# Patient Record
Sex: Female | Born: 1940 | Race: White | Hispanic: No | Marital: Married | Smoking: Never smoker
Health system: Southern US, Community
[De-identification: ages and names within clinical notes are randomized; demographics above are authoritative.]

## PROBLEM LIST (undated history)

## (undated) DIAGNOSIS — R112 Nausea with vomiting, unspecified: Secondary | ICD-10-CM

## (undated) DIAGNOSIS — F419 Anxiety disorder, unspecified: Secondary | ICD-10-CM

## (undated) DIAGNOSIS — K219 Gastro-esophageal reflux disease without esophagitis: Secondary | ICD-10-CM

## (undated) DIAGNOSIS — I10 Essential (primary) hypertension: Secondary | ICD-10-CM

## (undated) DIAGNOSIS — M1990 Unspecified osteoarthritis, unspecified site: Secondary | ICD-10-CM

## (undated) DIAGNOSIS — C801 Malignant (primary) neoplasm, unspecified: Secondary | ICD-10-CM

## (undated) DIAGNOSIS — Z923 Personal history of irradiation: Secondary | ICD-10-CM

## (undated) DIAGNOSIS — Z9889 Other specified postprocedural states: Secondary | ICD-10-CM

## (undated) DIAGNOSIS — E079 Disorder of thyroid, unspecified: Secondary | ICD-10-CM

## (undated) DIAGNOSIS — E785 Hyperlipidemia, unspecified: Secondary | ICD-10-CM

## (undated) HISTORY — DX: Anxiety disorder, unspecified: F41.9

## (undated) HISTORY — DX: Gastro-esophageal reflux disease without esophagitis: K21.9

## (undated) HISTORY — PX: CATARACT EXTRACTION: SUR2

## (undated) HISTORY — DX: Essential (primary) hypertension: I10

## (undated) HISTORY — PX: BREAST SURGERY: SHX581

## (undated) HISTORY — DX: Malignant (primary) neoplasm, unspecified: C80.1

## (undated) HISTORY — DX: Hyperlipidemia, unspecified: E78.5

## (undated) HISTORY — DX: Disorder of thyroid, unspecified: E07.9

---

## 1983-07-06 HISTORY — PX: ABDOMINAL HYSTERECTOMY: SHX81

## 1988-07-05 HISTORY — PX: BUNIONECTOMY: SHX129

## 1990-07-05 HISTORY — PX: BLADDER SUSPENSION: SHX72

## 1998-03-06 ENCOUNTER — Observation Stay (HOSPITAL_COMMUNITY): Admission: RE | Admit: 1998-03-06 | Discharge: 1998-03-07 | Payer: Self-pay | Admitting: Urology

## 1998-05-07 ENCOUNTER — Ambulatory Visit (HOSPITAL_COMMUNITY): Admission: RE | Admit: 1998-05-07 | Discharge: 1998-05-07 | Payer: Self-pay | Admitting: Obstetrics and Gynecology

## 1999-01-12 ENCOUNTER — Ambulatory Visit (HOSPITAL_BASED_OUTPATIENT_CLINIC_OR_DEPARTMENT_OTHER): Admission: RE | Admit: 1999-01-12 | Discharge: 1999-01-12 | Payer: Self-pay | Admitting: General Surgery

## 1999-11-23 ENCOUNTER — Encounter: Admission: RE | Admit: 1999-11-23 | Discharge: 1999-11-23 | Payer: Self-pay | Admitting: General Surgery

## 1999-11-23 ENCOUNTER — Encounter: Payer: Self-pay | Admitting: General Surgery

## 2000-11-24 ENCOUNTER — Encounter: Payer: Self-pay | Admitting: Obstetrics and Gynecology

## 2000-11-24 ENCOUNTER — Ambulatory Visit (HOSPITAL_COMMUNITY): Admission: RE | Admit: 2000-11-24 | Discharge: 2000-11-24 | Payer: Self-pay | Admitting: Obstetrics and Gynecology

## 2001-03-21 ENCOUNTER — Encounter: Payer: Self-pay | Admitting: Obstetrics and Gynecology

## 2001-03-21 ENCOUNTER — Encounter: Admission: RE | Admit: 2001-03-21 | Discharge: 2001-03-21 | Payer: Self-pay | Admitting: Obstetrics and Gynecology

## 2001-12-04 ENCOUNTER — Encounter: Payer: Self-pay | Admitting: Obstetrics and Gynecology

## 2001-12-04 ENCOUNTER — Encounter: Admission: RE | Admit: 2001-12-04 | Discharge: 2001-12-04 | Payer: Self-pay | Admitting: Obstetrics and Gynecology

## 2001-12-26 ENCOUNTER — Other Ambulatory Visit: Admission: RE | Admit: 2001-12-26 | Discharge: 2001-12-26 | Payer: Self-pay | Admitting: Obstetrics and Gynecology

## 2002-10-08 ENCOUNTER — Encounter: Admission: RE | Admit: 2002-10-08 | Discharge: 2002-10-08 | Payer: Self-pay | Admitting: Obstetrics and Gynecology

## 2002-10-08 ENCOUNTER — Encounter: Payer: Self-pay | Admitting: Obstetrics and Gynecology

## 2004-04-29 ENCOUNTER — Other Ambulatory Visit: Admission: RE | Admit: 2004-04-29 | Discharge: 2004-04-29 | Payer: Self-pay | Admitting: Obstetrics and Gynecology

## 2005-04-30 ENCOUNTER — Other Ambulatory Visit: Admission: RE | Admit: 2005-04-30 | Discharge: 2005-04-30 | Payer: Self-pay | Admitting: Obstetrics and Gynecology

## 2007-06-16 ENCOUNTER — Ambulatory Visit (HOSPITAL_COMMUNITY): Admission: RE | Admit: 2007-06-16 | Discharge: 2007-06-16 | Payer: Self-pay | Admitting: Obstetrics and Gynecology

## 2007-07-18 ENCOUNTER — Ambulatory Visit: Admission: RE | Admit: 2007-07-18 | Discharge: 2007-07-18 | Payer: Self-pay | Admitting: Gynecologic Oncology

## 2008-01-08 ENCOUNTER — Encounter (INDEPENDENT_AMBULATORY_CARE_PROVIDER_SITE_OTHER): Payer: Self-pay | Admitting: Obstetrics and Gynecology

## 2008-01-08 ENCOUNTER — Ambulatory Visit (HOSPITAL_COMMUNITY): Admission: RE | Admit: 2008-01-08 | Discharge: 2008-01-08 | Payer: Self-pay | Admitting: Obstetrics and Gynecology

## 2008-07-05 HISTORY — PX: KNEE ARTHROSCOPY: SHX127

## 2010-02-23 ENCOUNTER — Encounter: Payer: Self-pay | Admitting: Endocrinology

## 2010-05-15 ENCOUNTER — Ambulatory Visit: Payer: Self-pay | Admitting: Endocrinology

## 2010-05-15 DIAGNOSIS — G4700 Insomnia, unspecified: Secondary | ICD-10-CM

## 2010-05-15 DIAGNOSIS — I10 Essential (primary) hypertension: Secondary | ICD-10-CM

## 2010-05-15 DIAGNOSIS — R51 Headache: Secondary | ICD-10-CM

## 2010-05-15 DIAGNOSIS — E039 Hypothyroidism, unspecified: Secondary | ICD-10-CM

## 2010-05-15 DIAGNOSIS — E785 Hyperlipidemia, unspecified: Secondary | ICD-10-CM | POA: Insufficient documentation

## 2010-05-15 DIAGNOSIS — R519 Headache, unspecified: Secondary | ICD-10-CM | POA: Insufficient documentation

## 2010-05-15 DIAGNOSIS — K219 Gastro-esophageal reflux disease without esophagitis: Secondary | ICD-10-CM

## 2010-05-15 LAB — CONVERTED CEMR LAB: TSH: 0.41 microintl units/mL (ref 0.35–5.50)

## 2010-08-04 NOTE — Assessment & Plan Note (Signed)
Summary: NEW ENDO CONSULT/ THYROID/ MEDICARE COMPLETE/NWS #   Vital Signs:  Patient profile:   70 year old female Height:      61.5 inches (156.21 cm) Weight:      122.25 pounds (55.57 kg) BMI:     22.81 O2 Sat:      99 % on Room air Temp:     97.1 degrees F (36.17 degrees C) oral Pulse rate:   81 / minute BP sitting:   136 / 72  (left arm) Cuff size:   regular  Vitals Entered By: Brenton Grills CMA Duncan Dull) (May 15, 2010 2:13 PM)  O2 Flow:  Room air CC: New Endo/Thyroid/[facility]/aj Is Patient Diabetic? No   Referring Provider:  Krystal Clark NP Primary Provider:  Krystal Clark NP  CC:  New Endo/Thyroid/[facility]/aj.  History of Present Illness: pt says she was dx'ed with hypothyroidism at the age of 70.  she has been on thyroid medication since then.  she has been on 88 mcg/day, x many years.  she says her thyroid blood tests have been normal.  pt states 2 mos of moderate hair loss on the head, and assoc fatigue.    Current Medications (verified): 1)  Levothyroxine Sodium 88 Mcg Tabs (Levothyroxine Sodium) .Marland Kitchen.. 1 Tablet By Mouth Once Daily 2)  Pravastatin Sodium 40 Mg Tabs (Pravastatin Sodium) .Marland Kitchen.. 1 Tablet By Mouth Once Daily 3)  Omeprazole 20 Mg Cpdr (Omeprazole) .Marland Kitchen.. 1 Capsule By Mouth Once Daily 4)  Benazepril Hcl 20 Mg Tabs (Benazepril Hcl) .Marland Kitchen.. 1 Tablet By Mouth Once Daily 5)  Multivitamins  Tabs (Multiple Vitamin) .Marland Kitchen.. 1 Tablet By Mouth Once Daily 6)  Vitamin D 1000 Unit Tabs (Cholecalciferol) .Marland Kitchen.. 1 Tablet By Mouth Once Daily 7)  Zolpidem Tartrate 10 Mg Tabs (Zolpidem Tartrate) .Marland Kitchen.. 1 Tablet By Mouth At Bedtime 8)  Nitrostat 0.4 Mg Subl (Nitroglycerin) .... Dissolve 1 Tablet Under The Tongue As Needed For Chest Pain 9)  Butalbital-Apap-Caffeine 50-325-40 Mg Tabs (Butalbital-Apap-Caffeine) .... Take 1-2 Tablets By Mouth Every 6 Hours As Needed For Severe Headache 10)  Calcium + D 600-200 Mg-Unit Tabs (Calcium  Carbonate-Vitamin D) .Marland Kitchen.. 1 Tablet By Mouth Once Daily  Allergies (verified): 1)  ! Aspirin  Past History:  Past Medical History: Hyperlipidemia Hypertension Hypothyroidism  Past Surgical History: Hysterectomy-1972 Breast Biopsy (2 about 15 years ago)  Family History: Reviewed history and no changes required. Family History Diabetes 1st degree relative Family History High cholesterol Family History Hypertension Family History Kidney disease (Other [place] Relative) no thyroid dz  Social History: Reviewed history and no changes required. Retired Married cares for her mother Never Smoked Alcohol use-no Drug use-no Regular exercise-no Smoking Status:  never Drug Use:  no Does Patient Exercise:  no Seat Belt Use:  yes  Review of Systems  The patient denies fever.         denies depression, cramps, sob, rash, memory loss, constipation, numbness, blurry vision, rhinorrhea, and syncope.  she reports insomnia, dry skin, easy bruising, and myalgias.  she has lost a few lbs.  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   Head:  head: no deformity.  i cannot detect any alopecia. eyes: no periorbital swelling, no proptosis external nose and ears are normal mouth: no lesion seen Neck:  Supple without thyroid enlargement or tenderness.  Lungs:  Clear to auscultation bilaterally. Normal respiratory effort.  Heart:  Regular rate and rhythm without  murmurs or gallops noted. Normal S1,S2.   Msk:  muscle bulk and strength are grossly normal.  no obvious joint swelling.  gait is normal and steady  Extremities:  no deformity, except for oa changes. no edema Neurologic:  cn 2-12 grossly intact.   readily moves all 4's.   sensation is intact to touch on the feet  Skin:  normal texture and temp.  no rash.  not diaphoretic  Cervical Nodes:  No significant adenopathy.  Psych:  Alert and cooperative; normal mood and  affect; normal attention span and concentration.   Additional Exam:   FastTSH                   0.41 uIU/mL     Impression & Recommendations:  Problem # 1:  HYPOTHYROIDISM (ICD-244.9) well-replaced  Problem # 2:  hair loss could have a common (autoimmune) etiology with #1  Problem # 3:  fatigue not thyroid-related  Other Orders: TLB-TSH (Thyroid Stimulating Hormone) (84443-TSH) Consultation Level IV (84696)  Patient Instructions: 1)  blood tests are being ordered for you today.  please call [phone] to hear your test results. 2)  pending the test results, please continue the same medications for now. 3)  (update: i left message on phone-tree:  rx as we discussed) 4)  ret here prn   Orders Added: 1)  TLB-TSH (Thyroid Stimulating Hormone) [84443-TSH] 2)  Consultation Level IV [32440]   Immunization History:  Influenza Immunization History:    Influenza:  historical (04/04/2010)  Pneumovax Immunization History:    Pneumovax:  historical (04/26/2010)   Immunization History:  Influenza Immunization History:    Influenza:  Historical (04/04/2010)  Pneumovax Immunization History:    Pneumovax:  Historical (04/26/2010)  Preventive Care Screening  Last Pneumovax:    Date:  04/26/2010    Results:  Historical   Last Flu Shot:    Date:  04/04/2010    Results:  Historical   Pap Smear:    Date:  03/05/2009    Results:  normal   Colonoscopy:    Date:  07/05/2004    Results:  normal     Preventive Care Screening  Last Pneumovax:    Date:  04/26/2010    Results:  Historical   Last Flu Shot:    Date:  04/04/2010    Results:  Historical   Pap Smear:    Date:  03/05/2009    Results:  normal   Colonoscopy:    Date:  07/05/2004    Results:  normal      Appended Document: NEW ENDO CONSULT/ THYROID/ MEDICARE COMPLETE/NWS # cc [facility], witrh tsh result  Appended Document: NEW ENDO CONSULT/ THYROID/ MEDICARE COMPLETE/NWS # faxed lab  report and office note to [facility] at [phone]/AJ

## 2010-08-04 NOTE — Letter (Signed)
Summary: [facility]  [facility]   Imported By: Lester [place] 05/20/2010 08:36:35  _____________________________________________________________________  External Attachment:    Type:   Image     Comment:   External Document

## 2010-11-17 NOTE — Op Note (Signed)
Zoe Boone, Zoe Boone                 ACCOUNT NO.:  [id number]   MEDICAL RECORD NO.:  [id number]          PATIENT TYPE:  AMB   LOCATION:  SDC                           FACILITY:  [facility]   PHYSICIAN:  Guy Sandifer. Henderson Cloud, M.D. DATE OF BIRTH:  1941-05-16   DATE OF PROCEDURE:  01/08/2008  DATE OF DISCHARGE:                               OPERATIVE REPORT   PREOPERATIVE DIAGNOSIS:  Left ovarian cyst.   POSTOPERATIVE DIAGNOSIS:  Left ovarian cyst.   PROCEDURE:  Laparoscopy with bilateral salpingo-oophorectomy, extensive  lysis of adhesions and pelvic washings.   SURGEON:  Harold Hedge, MD   ANESTHESIA:  General endotracheal intubation.   SPECIMENS:  Bilateral tubes and ovaries and pelvic washings all to  pathology.   ESTIMATED BLOOD LOSS:  Minimal.   INDICATIONS AND CONSENT:  This patient is a 70 year old married white  female G2, P2 status post hysterectomy with a left ovarian cyst.  Details were dictated in the history and physical.  Laparoscopy with  bilateral salpingo-oophorectomy has been discussed preoperatively.  Potential risks and complications have been discussed including but  limited to infection, organ damage, bleeding requiring transfusion of  blood products with HIV and hepatitis acquisition, DVT, PE, pneumonia,  laparotomy, pelvic pain.  All questions have been answered and consent  is signed on the chart.   FINDINGS:  Upper abdomen is grossly normal.  Appendix is normal.  In the  pelvis, there is an approximately 2-3-cm translucent parovarian cyst.  There are some adhesions of the epiploic A of the sigmoid to the median  insertion of the utero-ovarian pedicle in the vaginal cuff.  On the  right side, there is a broad filmy adhesion from the bowel to the right  tube and ovary.  The pelvic sidewalls were free bilaterally.   PROCEDURE:  The patient was taken to operating room where she was  identified, placed in dorsosupine position, and general anesthesia was  induced  via endotracheal intubation.  She was then placed in dorsal  lithotomy position where she was prepped abdominally and vaginally.  Bladder straight catheterized.  A sponge on a ring clamp was placed in  the vagina and the patient was prepped in a sterile fashion.  The  infraumbilical and suprapubic areas were injected in the midline with  0.5% plain Marcaine.  An infraumbilical incision was made and a  disposable Veress needle was placed on the first attempt without  difficulty.  A normal syringe and drop test are noted.  A 2 L of gas was  then insufflated under low pressure with good tympany in the right upper  quadrant.  Veress needle was removed.  A 10/11 XL bladeless disposable  trocar sleeve was then placed using direct visualization with the  diagnostic laparoscopic.  After placement, a small suprapubic incision  was made in the midline and a 5-mm XL bladeless disposable trocar sleeve  was placed under direct visualization.  The above findings were noted.  The adhesions to the right tube and ovary were then taken down sharply  without difficulty.  Good hemostasis was maintained.  This was  done well  clear of the gut.  The infundibulopelvic ligament was then free and  mobile.  The course of the ureter was well clear of the area of surgery.  Then using the gyrus bipolar cautery cutting instrument, the right  infundibulopelvic ligament was taken down and it was worked across to  the medial point of attachment and the entire tube and ovary were cut  free.  The adhesions of the epiploic A to the vaginal cuff at the point  of insertion of the medial aspect of the tube and ovary were then freed  without difficulty.  Minor cautery was then used for hemostasis on the  adipose tissue only.  Similar procedure was then carried out with gyrus  instrument to release the left tube and ovary as well.  Then, using the  5-mm laparoscopic through the suprapubic trocar sleeve, the EndoCatch  device was  used to retrieve first the left tube and ovary then the right  tube and ovary separately.  Switching back to the operative laparoscope,  careful inspection reveals excellent hemostasis all around.  Copious  irrigation was carried out.  It should be noted that at the beginning of  the case prior to any dissection, pelvic washings were taken and the  fluid was aspirated for cytology.  The suprapubic trocar sleeve was  removed.  Pneumoperitoneum was reduced and good hemostasis was again  noted.  Pneumoperitoneum was completely reduced.  The umbilical trocar  sleeve was removed.  The umbilical incision was closed with 0 Vicryl  suture in the fascia under good visualization.  2-0 Vicryl interrupted  sutures used to reapproximate the umbilical incision and Dermabond was  placed on both incisions.  The vaginal sponges were removed.  All counts  were correct.  The patient was awakened and taken to recovery room in  stable condition.      Guy Sandifer Henderson Cloud, M.D.  Electronically Signed     JET/MEDQ  D:  01/08/2008  T:  01/08/2008  Job:  [id number]

## 2010-11-17 NOTE — H&P (Signed)
Zoe Boone, TOZER                 ACCOUNT NO.:  [id number]   MEDICAL RECORD NO.:  [id number]           PATIENT TYPE:   LOCATION:                                 FACILITY:   PHYSICIAN:  Guy Sandifer. Henderson Cloud, M.D. DATE OF BIRTH:  April 11, 1941   DATE OF ADMISSION:  DATE OF DISCHARGE:                              HISTORY & PHYSICAL   CHIEF COMPLAINT:  Ovarian cyst.   HISTORY OF PRESENT ILLNESS:  This patient is a 70 year old married white  female G2, P2, status post hysterectomy in 1979 and subsequent  laparotomy with question of ovarian cystectomy and a bladder tack  possible Burch procedure in 1987.  She recently had complaints of pelvic  discomfort.  Ultrasound in my office on June 07, 2007, revealed a 1.6-  cm cyst at the left ovary with a 5-mm nodular area noted.  The right  ovary appeared normal.  CA-125 was normal.  Consultation with Dr. Ronita Hipps of GYN/Oncology was undertaken.  He felt the odds of this  representing an ovarian malignancy was probably 1% or less.  He agreed  with the option of outpatient laparoscopic BSO.  After consideration,  the patient requested this surgery.  Potential risks and complications  have been reviewed preoperatively.  Her abdominal pain is gradually  increasing.  She has had no difficulty with her bowels, bladder, energy  level, or appetite.   PAST MEDICAL HISTORY:  1. Rosacea.  2. Hypertension.  3. Hypothyroidism.  4. DCIS of the left breast.  5. Basal cell carcinoma of the face.   PAST SURGICAL HISTORY:  1. Abdominal hysterectomy.  2. Right ovarian cystectomy with a question of a Burch procedure at      the same time.  3. Right breast cyst biopsy which was benign.   FAMILY HISTORY:  Diabetes in father, leukemia in father, eye carcinoma  in brother, and chronic hypertension in mother and father.   MEDICATIONS:  1. L-thyroxine 88 mcg daily.  2. Carvedilol 12.5 mg 3 times a day.   ALLERGIES:  Codeine.   SOCIAL HISTORY:  Denies  tobacco, alcohol, or drug abuse.   REVIEW OF SYSTEMS:  NEURO:  Denies headache.  CARDIAC:  No chest pain.  PULMONARY:  Denies shortness of breath.   PHYSICAL EXAMINATION:  VITAL SIGNS:  Height 5 feet 2 inches, weight 127  pounds, blood pressure 118/80.  HEENT:  Without thyromegaly.  LUNGS:  Clear to auscultation.  HEART:  Regular rate and rhythm.  BACK:  No CVA tenderness.  BREASTS:  No mass, traction, or discharge.  ABDOMEN:  Soft, nontender without masses.  PELVIC EXAM:  Normal genitalia without lesion.  Adnexa nontender without  masses.  EXTREMITIES:  Grossly within normal limits.  NEUROLOGICAL EXAM:  Grossly within normal limits.   ASSESSMENT:  Left ovarian cyst.   PLAN:  Laparoscopic bilateral salpingo-oophorectomy.      Guy Sandifer Henderson Cloud, M.D.  Electronically Signed     JET/MEDQ  D:  01/03/2008  T:  01/04/2008  Job:  [id number]

## 2010-11-17 NOTE — Consult Note (Signed)
Zoe Boone, KNACK                 ACCOUNT NO.:  [id number]   MEDICAL RECORD NO.:  [id number]          PATIENT TYPE:  OUT   LOCATION:  GYN                          FACILITY:  [facility]   PHYSICIAN:  John T. [name], M.D.    DATE OF BIRTH:  [birth year]   DATE OF CONSULTATION:  DATE OF DISCHARGE:                                 CONSULTATION   CHIEF COMPLAINT:  This 70 year old woman with a left adnexal mass is  seen at the request of Dr. Henderson Cloud for recommendations regarding  management.   HISTORY OF PRESENT ILLNESS:  This patient presents with an initial  complaint of sharp left lower quadrant pain intermittent over the past 6  months and associated with severe back pain.  She states the pain is  usually associated with activity but may occur when inactive and  comprises a sharp pulling lasting for few minutes.  This may or may  not radiate to the back (the patient is vague) and is not associated  with bowel function or meals.  During evaluation, she had an ultrasound  which showed a left ovary containing a  1.6 x 1.6 x 1.4 cm cyst with low-  level internal echoes and a 5 mm nodular area with no vascularity,  questionable dermoid versus  mucinous cyst.  Right ovary was negative.  There was no fluid in the cul-de-sac.  CA-125 value was low (less than  10) and a CT scan was obtained on 12/12 which revealed a left adnexal  mass measuring 1.7 x 2.7 with peripheral calcification.  Of note, the  patient had a previous hysterectomy for benign bleeding.  She notes  multiple other symptoms including atypical photosensitivity, headache,  fatigue, occasional nausea and diffuse pain all over the body  involving largely the muscles rather than joints.  She denies  arthralgias or bony pain.  There did not appear any skin rash or ulcers.   PAST MEDICAL HISTORY:  Rosacea, hypertension, hypothyroidism.  She is  status post prior abdominal hysterectomy for benign disease, right  ovarian cystectomy, bladder  sling and questionable Burch at a  different operation.  She had a basal cell removed from her face, benign  right breast biopsy and left breast biopsy with CIS.   CURRENT MEDICATIONS:  Synthroid, carvedilol, Restoril and Extra Strength  Tylenol.   ALLERGIES:  None known.   FAMILY HISTORY:  Father died of leukemia, brother had renal cancer and  another brother had melanoma.   PERSONAL AND SOCIAL HISTORY:  The patient is married and denies ethanol  or tobacco use.   REVIEW OF SYSTEMS:  As noted above, multiple areas of pain all over the  body.  Other than this detail, no positive in a 10 system review.   PHYSICAL EXAMINATION:  VITAL SIGNS:  Weight 125 pounds, blood pressure  150/100, repeat 140/90, pulse 72, temperature afebrile.  GENERAL:  The  patient states that her current pain score is three.  The patient is  extremely anxious, alert and oriented x3 in no acute distress.  LYMPH  SURVEY:  Negative for pathologic lymphadenopathy.  BACK:  No spinous or  CVA tenderness to percussion.  ABDOMEN:  Soft and benign with no  ascites, tenderness, mass or organomegaly.  Well-healed transverse  incision without hernia.  EXTREMITIES:  Full strength and range of  motion without edema, cords or Homans.  PELVIC:  External genitalia,  BUS, bladder and urethra and vagina are all normal with atrophic vaginal  mucosa.  Bimanual and rectovaginal examinations disclose absent uterus  and cervix with no mass or nodularity.  There is vague tenderness in the  left adnexa   ASSESSMENT:  1. Likely benign left adnexal mass.  2. Generalized pain syndrome of unknown etiology which might include      myofasciitis or some sort of chronic fatigue syndrome.   RECOMMENDATIONS:  I had a long discussion with the patient.  I believe  that her risks of harboring an ovarian malignancy are exceedingly small  (1% or less).  Options for management were discussed in detail including  performance of laparoscopic BSO  versus simply repeating  an ultrasound  in approximately 3 months and deferring any surgery if the patient is  relatively asymptomatic and has no evidence of change; in fact, the  patient could be followed indefinitely if she had a stable lesion on  ultrasound.  Because the likelihood of cancer is extremely low, I  believe it would be appropriate for Dr. Henderson Cloud to perform surgery if  this is her wish.      John T. [name], M.D.  Electronically Signed     JTS/MEDQ  D:  07/18/2007  T:  07/19/2007  Job:  [id number]   cc:   Guy Sandifer. Henderson Cloud, M.D.  Fax: [phone]   Telford Nab, R.N.  [address]  [place], [place] [address]

## 2011-04-01 LAB — COMPREHENSIVE METABOLIC PANEL
Alkaline Phosphatase: 47
Calcium: 9.5
GFR calc Af Amer: >60
GFR calc non Af Amer: >60
Potassium: 4.4
Sodium: 137
Total Bilirubin: 0.6

## 2011-04-01 LAB — CBC
Hemoglobin: 12.9
MCHC: 34.2
Platelets: 261
RBC: 3.93

## 2011-12-13 ENCOUNTER — Other Ambulatory Visit: Payer: Self-pay | Admitting: Internal Medicine

## 2011-12-13 DIAGNOSIS — N6320 Unspecified lump in the left breast, unspecified quadrant: Secondary | ICD-10-CM

## 2011-12-22 ENCOUNTER — Ambulatory Visit
Admission: RE | Admit: 2011-12-22 | Discharge: 2011-12-22 | Disposition: A | Discharge status: Home / Self Care | Payer: Medicare Other | Source: Ambulatory Visit | Attending: Internal Medicine | Admitting: Internal Medicine

## 2011-12-22 ENCOUNTER — Other Ambulatory Visit: Payer: Self-pay | Admitting: Internal Medicine

## 2011-12-22 DIAGNOSIS — N6320 Unspecified lump in the left breast, unspecified quadrant: Secondary | ICD-10-CM

## 2012-07-05 HISTORY — PX: COLONOSCOPY: SHX174

## 2014-01-10 ENCOUNTER — Other Ambulatory Visit: Payer: Self-pay

## 2014-02-02 DIAGNOSIS — C801 Malignant (primary) neoplasm, unspecified: Secondary | ICD-10-CM

## 2014-02-02 HISTORY — DX: Malignant (primary) neoplasm, unspecified: C80.1

## 2014-02-05 ENCOUNTER — Telehealth (INDEPENDENT_AMBULATORY_CARE_PROVIDER_SITE_OTHER): Payer: Self-pay

## 2014-02-05 NOTE — Telephone Encounter (Signed)
[facility] pushed to [facility].

## 2014-02-05 NOTE — Telephone Encounter (Signed)
Called pt to ask about r/s her appt to another day and another physician at [facility]. I asked if the pt would switch over to Dr Erroll Luna which is also a breast cancer surgeon. I explained to the pt that Dr Donne Hazel was wanting to see if the pt would see Dr Brantley Stage b/c of Dr Cristal Generous OR availability being pushed out for several weeks now. I tried to explain to the pt that we don't like to keep our breast cancer pt's waiting for a long period of time due to the surgeon's schedule. I advised pt that her new appt would be for 8/7 arrive at 2:15/2:30. I advised pt that I would get her images pushed over from [facility]. I will request her reports to be faxed to [place]. Pt understands.

## 2014-02-05 NOTE — Telephone Encounter (Signed)
LMOM for [place] w/[facility]. To call me so I can talk to her about getting images pushed over to [facility] w/[facility]. I asked for her to ask for me when she calls back.

## 2014-02-06 ENCOUNTER — Encounter (INDEPENDENT_AMBULATORY_CARE_PROVIDER_SITE_OTHER): Payer: Self-pay

## 2014-02-07 ENCOUNTER — Ambulatory Visit (INDEPENDENT_AMBULATORY_CARE_PROVIDER_SITE_OTHER): Payer: Medicare Other | Admitting: General Surgery

## 2014-02-08 ENCOUNTER — Telehealth: Payer: Self-pay | Admitting: *Deleted

## 2014-02-08 ENCOUNTER — Telehealth (INDEPENDENT_AMBULATORY_CARE_PROVIDER_SITE_OTHER): Payer: Self-pay | Admitting: *Deleted

## 2014-02-08 ENCOUNTER — Encounter (INDEPENDENT_AMBULATORY_CARE_PROVIDER_SITE_OTHER): Payer: Self-pay | Admitting: Surgery

## 2014-02-08 ENCOUNTER — Ambulatory Visit (INDEPENDENT_AMBULATORY_CARE_PROVIDER_SITE_OTHER): Payer: Medicare Other | Admitting: Surgery

## 2014-02-08 VITALS — BP 126/80 | HR 76 | Temp 97.8°F | Ht 62.0 in | Wt 121.0 lb

## 2014-02-08 DIAGNOSIS — C50919 Malignant neoplasm of unspecified site of unspecified female breast: Secondary | ICD-10-CM

## 2014-02-08 DIAGNOSIS — C50912 Malignant neoplasm of unspecified site of left female breast: Secondary | ICD-10-CM

## 2014-02-08 NOTE — Patient Instructions (Signed)
Mastectomy, With or Without Reconstruction °Mastectomy (removal of the breast) is a procedure most commonly used to treat cancer (tumor) of the breast. Different procedures are available for treatment. This depends on the stage of the tumor (abnormal growths). Discuss this with your caregiver, surgeon (a specialist for performing operations such as this), or oncologist (someone specialized in the treatment of cancer). With proper information, you can decide which treatment is best for you. Although the sound of the word cancer is frightening to all of us, the new treatments and medications can be a source of reassurance and comfort. If there are things you are worried about, discuss them with your caregiver. He or she can help comfort you and your family. Some of the different procedures for treating breast cancer are: °· Radical (extensive) mastectomy. This is an operation used to remove the entire breast, the muscles under the breast, and all of the glands (lymph nodes) under the arm. With all of the new treatments available for cancer of the breast, this procedure has become less common. °· Modified radical mastectomy. This is a similar operation to the radical mastectomy described above. In the modified radical mastectomy, the muscles of the chest wall are not removed unless one of the lessor muscles is removed. One of the lessor muscles may be removed to allow better removal of the lymph nodes. The axillary lymph nodes are also removed. Rarely, during an axillary node dissection nerves to this area are damaged. Radiation therapy is then often used to the area following this surgery. °· A total mastectomy also known as a complete or simple mastectomy. It involves removal of only the breast. The lymph nodes and the muscles are left in place. °· In a lumpectomy, the lump is removed from the breast. This is the simplest form of surgical treatment. A sentinel lymph node biopsy may also be done. Additional treatment  may be required. °RISKS AND COMPLICATIONS °The main problems that follow removal of the breast include: °· Infection (germs start growing in the wound). This can usually be treated with antibiotics (medications that kill germs). °· Lymphedema. This means the arm on the side of the breast that was operated on swells because the lymph (tissue fluid) cannot follow the main channels back into the body. This only occurs when the lymph nodes have had to be removed under the arm. °· There may be some areas of numbness to the upper arm and around the incision (cut by the surgeon) in the breast. This happens because of the cutting of or damage to some of the nerves in the area. This is most often unavoidable. °· There may be difficulty moving the arm in a full range of motion (moving in all directions) following surgery. This usually improves with time following use and exercise. °· Recurrence of breast cancer may happen with the very best of surgery and follow up treatment. Sometimes small cancer cells that cannot be seen with the naked eye have already spread at the time of surgery. When this happens other treatment is available. This treatment may be radiation, medications or a combination of both. °RECONSTRUCTION °Reconstruction of the breast may be done immediately if there is not going to be post-operative radiation. This surgery is done for cosmetic (improve appearance) purposes to improve the physical appearance after the operation. This may be done in two ways: °· It can be done using a saline filled prosthetic (an artificial breast which is filled with salt water). Silicone breast implants are now   re-approved by the FDA and are being commonly used.  Reconstruction can be done using the body's own muscle/fat/skin. Your caregiver will discuss your options with you. Depending upon your needs or choice, together you will be able to determine which procedure is best for you. Document Released: 03/16/2001 Document  Revised: 03/15/2012 Document Reviewed: 11/07/2007 [facility] Patient Information 2015 [place], [place]. This information is not intended to replace advice given to you by your health care provider. Make sure you discuss any questions you have with your health care provider. Lumpectomy A lumpectomy is a form of "breast conserving" or "breast preservation" surgery. It may also be referred to as a partial mastectomy. During a lumpectomy, the portion of the breast that contains the cancerous tumor or breast mass (the lump) is removed. Some normal tissue around the lump may also be removed to make sure all of the tumor has been removed.  LET [facility] CARE PROVIDER KNOW ABOUT:  Any allergies you have.  All medicines you are taking, including vitamins, herbs, eye drops, creams, and over-the-counter medicines.  Previous problems you or members of your family have had with the use of anesthetics.  Any blood disorders you have.  Previous surgeries you have had.  Medical conditions you have. RISKS AND COMPLICATIONS Generally, this is a safe procedure. However, problems can occur and include:  Bleeding.  Infection.  Pain.  Temporary swelling.  Change in the shape of the breast, particularly if a large portion is removed. BEFORE THE PROCEDURE  Ask your health care provider about changing or stopping your regular medicines. This is especially important if you are taking diabetes medicines or blood thinners.  Do not eat or drink anything after midnight on the night before the procedure or as directed by your health care provider. Ask your health care provider if you can take a sip of water with any approved medicines.  On the day of surgery, your health care provider will use a mammogram or ultrasound to locate and mark the tumor in your breast. These markings on your breast will show where the cut (incision) will be made. PROCEDURE   An IV tube will be put into one of your veins.  You may  be given medicine to help you relax before the surgery (sedative). You will be given one of the following:  A medicine that numbs the area (local anesthetic).  A medicine that makes you fall asleep (general anesthetic).  Your health care provider will use a kind of electric scalpel that uses heat to minimize bleeding (electrocautery knife).  A curved incision (like a smile or frown) that follows the natural curve of your breast is made, to allow for minimal scarring and better healing.  The tumor will be removed with some of the surrounding tissue. This will be sent to the lab for analysis. Your health care provider may also remove your lymph nodes at this time if needed.  Sometimes, but not always, a rubber tube called a drain will be surgically inserted into your breast area or armpit to collect excess fluid that may accumulate in the space where the tumor was. This drain is connected to a plastic bulb on the outside of your body. This drain creates suction to help remove the fluid.  The incisions will be closed with stitches (sutures).  A bandage may be placed over the incisions. AFTER THE PROCEDURE  You will be taken to the recovery area.  You will be given medicine for pain.  A small  rubber drain may be placed in the breast for 2-3 days to prevent a collection of blood (hematoma) from developing in the breast. You will be given instructions on caring for the drain before you go home.  A pressure bandage (dressing) will be applied for 1-2 days to prevent bleeding. Ask your health care provider how to care for your bandage at home. Document Released: 08/02/2006 Document Revised: 11/05/2013 Document Reviewed: 11/24/2012 [facility] Patient Information 2015 [place], [place]. This information is not intended to replace advice given to you by your health care provider. Make sure you discuss any questions you have with your health care provider.

## 2014-02-08 NOTE — Telephone Encounter (Signed)
LM with Mr. Vorce to have pt return my call.  Advise pt her MRI of Breast is scheduled for 02-19-14 at [facility].  Arrive at 10:00 a.m.  [facility] not have availability before 02-22-14 so we had to choose [facility].  Anderson Malta

## 2014-02-08 NOTE — Telephone Encounter (Signed)
Received a call from [place] at pts PCP office requesting an appt w/ Dr. Jana Hakim.  Looked pt up and saw that she has an appt with Dr. Brantley Stage today.  Informed Dierra that pt has already been referred to the surgeon and that they will refer her to Dr. Jana Hakim after a treatment plan is decided.  Called and left a message for the pt to explain this process.  Emailed Arts development officer and Dr. Brantley Stage at [facility] to make them aware since the pt has an appt today to see him.

## 2014-02-08 NOTE — Progress Notes (Signed)
Patient ID: Zoe Boone, female   DOB: 1940-11-10, 73 y.o.   MRN: [id number]  Chief Complaint  Patient presents with  . eval left breast    HPI Zoe Boone is a 73 y.o. female.   HPI Patient presents from  the [facility] for mammographic abnormality detected on screening mammogram. 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 The patient has had bilateral breast biopsies many years ago. She is a remote diagnosis of atypical ductal hyperplasia. No significant family history of breast cancer. Past Medical History  Diagnosis Date  . Anxiety   . Hypertension   . Cancer [phone]    left breast mammary carcinoma  . GERD (gastroesophageal reflux disease)   . Hyperlipidemia   . Thyroid disease     Past Surgical History  Procedure Laterality Date  . Abdominal hysterectomy    . Breast surgery    . Bladder aspiration      Family History  Problem Relation Age of Onset  . Leukemia Father   . Diabetes Father   . Cancer Father     luekemia  . Skin cancer Mother   . Hypertension Mother   . Kidney cancer Brother   . Melanoma Brother     Social History History  Substance Use Topics  . Smoking status: Never Smoker   . Smokeless tobacco: Not on file  . Alcohol Use: No    Allergies  Allergen Reactions  . Aspirin     Current Outpatient Prescriptions  Medication Sig Dispense Refill  . ALPRAZolam (XANAX) 0.25 MG tablet Take 0.25 mg by mouth at bedtime as needed for anxiety.      . benazepril (LOTENSIN) 20 MG tablet Take 20 mg by mouth daily.      . Cholecalciferol 1000 UNITS capsule Take 1,000 Units by mouth daily.      Marland Kitchen levothyroxine (SYNTHROID, LEVOTHROID) 75 MCG tablet Take 75 mcg by mouth daily before breakfast.      . metoprolol succinate (TOPROL-XL) 25 MG 24 hr tablet Take 25 mg by mouth daily.      . Multiple Vitamin (MULTIVITAMIN) tablet Take 1 tablet by mouth daily.       . nitroGLYCERIN (NITROSTAT) 0.4 MG SL tablet Place 0.4 mg under the tongue every 5 (five) minutes as needed for chest pain.      . pravastatin (PRAVACHOL) 40 MG tablet Take 40 mg by mouth daily.      . temazepam (RESTORIL) 15 MG capsule Take 15 mg by mouth at bedtime as needed for sleep.      . valACYclovir (VALTREX) 500 MG tablet Take 500 mg by mouth 2 (two) times daily.       No current facility-administered medications for this visit.    Review of Systems Review of Systems  Constitutional: Negative for fever, chills and unexpected weight change.  HENT: Negative for congestion, hearing loss, sore throat, trouble swallowing and voice change.   Eyes: Negative for visual disturbance.  Respiratory: Negative for cough and wheezing.   Cardiovascular: Negative for chest pain, palpitations and leg swelling.  Gastrointestinal: Negative for nausea, vomiting, abdominal pain, diarrhea, constipation, blood in stool, abdominal distention and anal bleeding.  Genitourinary: Negative for hematuria, vaginal bleeding and difficulty urinating.  Musculoskeletal: Negative for arthralgias.  Skin: Negative for rash and wound.  Neurological: Negative for seizures, syncope and headaches.  Hematological: Negative for adenopathy. Does not bruise/bleed easily.  Psychiatric/Behavioral: Negative for confusion.    Blood pressure 126/80, pulse 76, temperature 97.8 F (36.6 C), height 5' 2"  (1.575 m), weight 121 lb (54.885 kg).  Physical Exam Physical Exam  Constitutional: She is oriented to person, place, and time. She appears well-developed and well-nourished.  HENT:  Head: Normocephalic and atraumatic.  Eyes: Pupils are equal, round, and reactive to light. No scleral icterus.  Neck: Normal range of motion.  Cardiovascular: Normal rate and regular rhythm.   Pulmonary/Chest: Right breast exhibits no inverted nipple, no mass, no nipple discharge, no skin change and no tenderness. Left breast exhibits no  inverted nipple, no mass, no nipple discharge, no skin change and no tenderness. Breasts are symmetrical.    Musculoskeletal: Normal range of motion.  Lymphadenopathy:    She has no cervical adenopathy.  Neurological: She is alert and oriented to person, place, and time.  Skin: Skin is warm and dry.  Psychiatric: She has a normal mood and affect. Her behavior is normal. Judgment and thought content normal.    Data Reviewed Mammogram ultrasound reviewed. 7 mm mass left upper-outer quadrant core biopsy proven to be invasive ductal carcinoma. HER-2/neu negative ER and PR receptors pending  Assessment    Stage I left breast cancer  Very dense breast tissue on exam    Plan    Patient will need MRI given breast density for surgical planning. She has  very dense breasts and is difficult to examine. Return to office after MRI completed for surgical planning. Discussed both breast conservation and mastectomy with patient. She will think about her options and talk more when she returns. Family present to hear conversation and opportunity for questions given.       Elmar Antigua A. 02/08/2014, 4:57 PM

## 2014-02-11 NOTE — Telephone Encounter (Signed)
Pt returned my call and she was made aware of her appt for her MRI.  Zoe Boone

## 2014-02-12 ENCOUNTER — Telehealth: Payer: Self-pay | Admitting: *Deleted

## 2014-02-12 NOTE — Telephone Encounter (Signed)
Received referral from [facility].  Called and left message for pt to return my call so I can schedule her w/ a med onc.

## 2014-02-14 ENCOUNTER — Telehealth: Payer: Self-pay | Admitting: *Deleted

## 2014-02-14 NOTE — Telephone Encounter (Signed)
Spoke with patient and offered [facility] since she lives in [place].  Patient is requesting Dr. Jana Hakim.  Informed patient that Dr. Jana Hakim is out of town this week and I would discuss with him upon his return on Monday to give me a slot as his schedule his booked.  Informed her we would call back.  Patient verbalized understanding.

## 2014-02-19 ENCOUNTER — Ambulatory Visit
Admission: RE | Admit: 2014-02-19 | Discharge: 2014-02-19 | Disposition: A | Discharge status: Home / Self Care | Payer: Medicare Other | Source: Ambulatory Visit | Attending: Surgery | Admitting: Surgery

## 2014-02-19 DIAGNOSIS — C50912 Malignant neoplasm of unspecified site of left female breast: Secondary | ICD-10-CM

## 2014-02-19 MED ORDER — GADOBENATE DIMEGLUMINE 529 MG/ML IV SOLN: 11.0000 mL | Freq: Once | INTRAVENOUS | Status: AC | PRN

## 2014-02-20 ENCOUNTER — Telehealth: Payer: Self-pay | Admitting: *Deleted

## 2014-02-20 NOTE — Telephone Encounter (Signed)
Received appt date and time from Dr. Jana Hakim.  Called pt and confirmed 02/21/14 appt w/ her.  Unable to mail before appt letter - gave verbal.  Unable to mail welcoming packet - gave directions and instruction.  Unable to mail intake form - placed a note for one to be given a time of check in.  Informed Dr. Jana Hakim.  Created chart, added to spreadsheet and placed in his box.

## 2014-02-20 NOTE — Progress Notes (Signed)
Location of Breast Cancer:Left Breast 7 mm upper outer quadrant.  Histology per Pathology Report:01/16/14 Left needle core biopsy mass 12 o'clock 4 cm invasive mammary cancer  Receptor Status: ER(), PR (), Her2-neu (-)  Did patient present with symptoms (if so, please note symptoms) or was this found on screening mammography?:Found on screening mammogram. 02/19/14:bilateral breast PXT:[id number]:  No abnormal enhancement associated the known malignancy within the  superior left breast. There is no evidence for adenopathy and there  are no additional findings.   Past/Anticipated interventions by surgeon, if EVO:[id number] to see Dr.Cornettte 02/22/14.  Past/Anticipated interventions by medical oncology, if any: Chemotherapy: seen by Dr.Magrinat 02/21/14.chemotherapy not reccommended.ssuggests lumpectomy, radiation and 5 years of anti-estrogen.  Lymphedema issues, if any: No  Pain issues, if any: No   SAFETY ISSUES:  Prior radiation? No  Pacemaker/ICD?No   Possible current pregnancy?No hysterectomy.last period age 89.  Is the patient on methotrexate?No  Current Complaints / other details:Married. Menses age 67. 2 children first age 42.Last menstrual cycle age 49. Hormonal replacement therapy  6 to 8 years. Allergies:aspirin    Arlyss Repress, RN 02/20/2014,10:26 AM

## 2014-02-21 ENCOUNTER — Ambulatory Visit (HOSPITAL_BASED_OUTPATIENT_CLINIC_OR_DEPARTMENT_OTHER): Payer: Medicare Other | Admitting: Oncology

## 2014-02-21 ENCOUNTER — Ambulatory Visit: Payer: Medicare Other

## 2014-02-21 ENCOUNTER — Encounter: Payer: Self-pay | Admitting: Oncology

## 2014-02-21 ENCOUNTER — Telehealth: Payer: Self-pay | Admitting: Oncology

## 2014-02-21 ENCOUNTER — Encounter: Payer: Self-pay | Admitting: Radiation Oncology

## 2014-02-21 ENCOUNTER — Ambulatory Visit
Admission: RE | Admit: 2014-02-21 | Discharge: 2014-02-21 | Disposition: A | Discharge status: Home / Self Care | Payer: Medicare Other | Source: Ambulatory Visit | Attending: Radiation Oncology | Admitting: Radiation Oncology

## 2014-02-21 VITALS — BP 188/96 | HR 57 | Temp 98.6°F | Wt 119.0 lb

## 2014-02-21 VITALS — BP 187/92 | HR 59 | Temp 98.6°F | Resp 20 | Ht 62.0 in | Wt 119.2 lb

## 2014-02-21 DIAGNOSIS — I10 Essential (primary) hypertension: Secondary | ICD-10-CM | POA: Insufficient documentation

## 2014-02-21 DIAGNOSIS — Z90710 Acquired absence of both cervix and uterus: Secondary | ICD-10-CM | POA: Insufficient documentation

## 2014-02-21 DIAGNOSIS — Z510 Encounter for antineoplastic radiation therapy: Secondary | ICD-10-CM | POA: Insufficient documentation

## 2014-02-21 DIAGNOSIS — Z170 Estrogen receptor positive status [ER+]: Secondary | ICD-10-CM | POA: Insufficient documentation

## 2014-02-21 DIAGNOSIS — E039 Hypothyroidism, unspecified: Secondary | ICD-10-CM | POA: Diagnosis not present

## 2014-02-21 DIAGNOSIS — C50412 Malignant neoplasm of upper-outer quadrant of left female breast: Secondary | ICD-10-CM

## 2014-02-21 DIAGNOSIS — E785 Hyperlipidemia, unspecified: Secondary | ICD-10-CM | POA: Diagnosis not present

## 2014-02-21 DIAGNOSIS — F411 Generalized anxiety disorder: Secondary | ICD-10-CM | POA: Insufficient documentation

## 2014-02-21 DIAGNOSIS — C50419 Malignant neoplasm of upper-outer quadrant of unspecified female breast: Secondary | ICD-10-CM | POA: Insufficient documentation

## 2014-02-21 NOTE — Telephone Encounter (Signed)
per pof to sch pt appt-gave pt copy of sch °

## 2014-02-21 NOTE — Progress Notes (Signed)
Zoe Boone  Telephone:([phone] Fax:([phone]) [phone]     ID: [name] DOB: [birth year]  MR#: [id number]  OEU#:[id number]  Patient Care Team: Zoe Mina, MD as PCP - General (Internal Medicine) Zoe Cruel, MD as Consulting Physician (Oncology) Zoe Faster. 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 Additional views and left breast ultrasonography confirmed a 7 mm mass in the upper-outer quadrant. This was biopsied under ultrasound guidance in [facility] 01/17/2014, and the pathology showed ([id number]) an invasive ductal carcinoma, E-cadherin positive, grade 1 or 2, estrogen receptor 99% positive, progesterone receptor 14% positive, with no amplification of HER-2, and with an MIB-1 of 17%.  On 02/19/2014 the patient underwent bilateral breast MRI. This showed a signal void in the superior left breast, but no abnormal enhancement, suggesting that most if not all of the original lesion was removed and biopsied.  The patient's subsequent history is as detailed below  INTERVAL HISTORY: Zoe Boone was evaluated at the breast clinic 02/21/2014.  REVIEW OF SYSTEMS: She has a history of migraines, which can be severe. She is noticing some skin changes which are probably age related. She has high blood pressure, but tells me she had a recent stress test which was "fine". She does not exercise regularly but is very active in activities of daily living, and does all her housework, cocaine, shopping, and driving. A detailed review of systems today was otherwise noncontributory  PAST MEDICAL HISTORY: Past Medical History  Diagnosis Date    . Anxiety   . Hypertension   . Cancer [id number]    left breast mammary carcinoma  . GERD (gastroesophageal reflux disease)   . Hyperlipidemia   . Thyroid disease     PAST SURGICAL HISTORY: Past Surgical History  Procedure Laterality Date  . Abdominal hysterectomy    . Breast surgery    . Bladder aspiration      FAMILY HISTORY Family History  Problem Relation Age of Onset  . Leukemia Father   . Diabetes Father   . Cancer Father     luekemia  . Skin cancer Mother   . Hypertension Mother   . Kidney cancer Brother   . Melanoma Brother    the patient's father died from complications of chronic leukemia at the age of 71. He had been diagnosed at age 23. The patient's mother died from "old age" at age 90. The patient has 3 brothers and 2 sisters. One brother was diagnosed with kidney cancer at the age of 12. There is one maternal aunt (out of 2) with breast cancer diagnosed at age 60, , and one paternal aunt (33) diagnosed with breast cancer at age 69. There is no history of ovarian cancer in the family.  GYNECOLOGIC HISTORY:  No LMP recorded. Menarche age 8, first live birth age 64. The patient is GX P2. She underwent abdominal hysterectomy and subsequent bilateral salpingo-oophorectomy at age 89. She took hormone replacement for approximately 10 years.  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 Her husband Zoe Boone is retired from [employer]. It suggests that 2 of them at home, with no pets. Their daughter Zoe Boone  is a Education officer, museum in [place], as is your second daughter, Zoe Boone. The patient has 4 grandchildren, including a granddaughter who is just entering medical school in [place], 2 grandsons at [employer], and a fourth is a Print production planner. The patient attends a local [name]     ADVANCED DIRECTIVES: Not in place   HEALTH MAINTENANCE: History  Substance Use Topics  .  Smoking status: Never Smoker   . Smokeless tobacco: Not on file  . Alcohol Use: No     Colonoscopy: Up to date/Zoe Boone  PAP:  Bone density:  Lipid panel:  Allergies  Allergen Reactions  . Aspirin     Current Outpatient Prescriptions  Medication Sig Dispense Refill  . ALPRAZolam (XANAX) 0.25 MG tablet Take 0.25 mg by mouth at bedtime as needed for anxiety.      . benazepril (LOTENSIN) 20 MG tablet Take 20 mg by mouth daily.      . Cholecalciferol 1000 UNITS capsule Take 1,000 Units by mouth daily.      Marland Kitchen levothyroxine (SYNTHROID, LEVOTHROID) 75 MCG tablet Take 75 mcg by mouth daily before breakfast.      . metoprolol succinate (TOPROL-XL) 25 MG 24 hr tablet Take 25 mg by mouth daily.      . Multiple Vitamin (MULTIVITAMIN) tablet Take 1 tablet by mouth daily.      . nitroGLYCERIN (NITROSTAT) 0.4 MG SL tablet Place 0.4 mg under the tongue every 5 (five) minutes as needed for chest pain.      . pravastatin (PRAVACHOL) 40 MG tablet Take 40 mg by mouth daily.      . temazepam (RESTORIL) 15 MG capsule Take 15 mg by mouth at bedtime as needed for sleep.      . valACYclovir (VALTREX) 500 MG tablet Take 500 mg by mouth 2 (two) times daily.       No current facility-administered medications for this visit.    OBJECTIVE: Middle-aged white woman who appears stated age 73 Vitals:   02/21/14 0851  BP: 187/92  Pulse: 59  Temp: 98.6 F (37 C)  Resp: 20     Body mass index is 21.8 kg/(m^2).    ECOG FS:0 - Asymptomatic  Ocular: Sclerae unicteric, EOMs intact Ear-nose-throat: Oropharynx clear, dentition in fair repair Lymphatic: No cervical or supraclavicular adenopathy Lungs no rales or rhonchi, good excursion bilaterally Heart regular rate and rhythm, no murmur appreciated Abd soft, nontender, positive bowel sounds MSK no focal spinal tenderness, no joint edema Neuro: non-focal, well-oriented, anxious affect Breasts: The right breast is status post remote biopsy. That  was for benign fibrocystic change. There are no skin or nipple changes of concern. There are no palpable masses. The right axilla is benign. The left breast is status post recent biopsy. I do not palpate any masses. There are no skin or nipple changes of concern. The left axilla is benign.   LAB RESULTS:  CMP     Component Value Date/Time   NA 137 01/01/2008 1030   K 4.4 01/01/2008 1030   CL 102 01/01/2008 1030   CO2 30 01/01/2008 1030   GLUCOSE 95 01/01/2008 1030   BUN 15 01/01/2008 1030   CREATININE 0.75 01/01/2008 1030   CALCIUM 9.5 01/01/2008 1030   PROT 6.8 01/01/2008 1030   ALBUMIN 4.0 01/01/2008 1030   AST 22 01/01/2008 1030   ALT 17 01/01/2008 1030   ALKPHOS 47 01/01/2008 1030   BILITOT 0.6 01/01/2008 1030   GFRNONAA >60 01/01/2008 1030  GFRAA  Value: >60        The eGFR has been calculated using the MDRD equation. This calculation has not been validated in all clinical 01/01/2008 1030    I No results found for this basename: SPEP,  UPEP,   kappa and lambda light chains    Lab Results  Component Value Date   WBC 4.7 01/01/2008   HGB 12.9 01/01/2008   HCT 37.8 01/01/2008   MCV 96.2 01/01/2008   PLT 261 01/01/2008      Chemistry      Component Value Date/Time   NA 137 01/01/2008 1030   K 4.4 01/01/2008 1030   CL 102 01/01/2008 1030   CO2 30 01/01/2008 1030   BUN 15 01/01/2008 1030   CREATININE 0.75 01/01/2008 1030      Component Value Date/Time   CALCIUM 9.5 01/01/2008 1030   ALKPHOS 47 01/01/2008 1030   AST 22 01/01/2008 1030   ALT 17 01/01/2008 1030   BILITOT 0.6 01/01/2008 1030       No results found for this basename: LABCA2    No components found with this basename: LABCA125    No results found for this basename: INR,  in the last 168 hours  Urinalysis No results found for this basename: colorurine,  appearanceur,  labspec,  phurine,  glucoseu,  hgbur,  bilirubinur,  ketonesur,  proteinur,  urobilinogen,  nitrite,  leukocytesur    STUDIES: Mr Breast Bilateral W Wo  Contrast  02/19/2014   CLINICAL DATA:  Recently diagnosed left breast invasive mammary carcinoma. Preoperative evaluation.  LABS:  BUN and creatinine were obtained on site at [facility] at  [address]  Results:  BUN 11 mg/dL,  Creatinine 0.8 mg/dL.  EXAM: BILATERAL BREAST MRI WITH AND WITHOUT CONTRAST  TECHNIQUE: Multiplanar, multisequence MR images of both breasts were obtained prior to and following the intravenous administration of 89m of MultiHance  THREE-DIMENSIONAL MR IMAGE RENDERING ON INDEPENDENT WORKSTATION:  Three-dimensional MR images were rendered by post-processing of the original MR data on an independent workstation. The three-dimensional MR images were interpreted, and findings are reported in the following complete MRI report for this study. Three dimensional images were evaluated at the independent DynaCad workstation  [facility]:  [facility]. Also, comparison previous left breast ultrasound dated 01/14/2014.  FINDINGS: Breast composition: c:  Heterogeneous fibroglandular tissue  Background parenchymal enhancement: Mild  Right breast: No mass or abnormal enhancement.  Left breast: There is a signal void located within the superior left breast related to the patient's recent left breast ultrasound guided biopsy. There is no abnormal enhancement seen in this region in the area of the patient's known malignancy. There is a smaller signal void located within the inferior left breast related to a previous benign ultrasound-guided core biopsy. There is no abnormal enhancement within the left breast.  Lymph nodes: No abnormal appearing lymph nodes.  Ancillary findings:  None.  IMPRESSION: No abnormal enhancement associated the known malignancy within the superior left breast. There is no evidence for adenopathy and there are no additional findings.  RECOMMENDATION: Treatment plan.  BI-RADS  CATEGORY  6: Known biopsy-proven malignancy.   Electronically Signed   By: RLuberta RobertsonM.D.   On: 02/19/2014 15:18    ASSESSMENT: 73y.o. [place] woman s/p left upper-outer quadrant biopsy 01/16/2014 for a clinical T1b N0, stage IA invasive ductal breast cancer  PLAN: We spent the better part of today's  hour-long appointment discussing the biology of breast cancer in general, and the specifics of the patient's tumor in particular. Zoe Boone understands she has a slow-growing, not aggressive looking, stage I invasive ductal carcinoma, which "eats" estrogen. This is most consistent with a luminal-A. subtype. The benefit of chemotherapy in these cases is very marginal, and unless we have a surprise from the definitive surgery, I do not anticipate she will need chemotherapy.  She understands that optimal local therapy involves lumpectomy and radiation, and she has appointments with Dr. Pablo Ledger later today and Dr. Brantley Stage tomorrow. When she completes her radiation treatments, we will discuss anti-estrogens, which in her case likely will be tamoxifen. We discussed possible toxicities, side effects and complications in a preliminary fashion. She understands she will need to be on anti-estrogens for 5 years.  By the end of the visit Chrys Racer felt much less anxious. She understands the goal of treatment in her case is cure, and that she has an excellent chance, with standard treatment, of this breast cancer not recurring. She agrees with the overall plan. She knows to call for any problems that may develop before next visit with me, which will be mid October.    Zoe Cruel, MD   02/21/2014 9:38 AM

## 2014-02-21 NOTE — Progress Notes (Signed)
Please see the Nurse Progress Note in the MD Initial Consult Encounter for this patient. 

## 2014-02-21 NOTE — Progress Notes (Signed)
Checked in new pt with no financial concerns. °

## 2014-02-21 NOTE — Progress Notes (Signed)
Radiation Oncology         [phone]) [phone] ________________________________  Initial outpatient Consultation - Date: 02/21/2014   Name: Zoe Boone MRN: [id number]   DOB: [birth year]  REFERRING PHYSICIAN: Brantley Stage Joyice Faster., MD  DIAGNOSIS:    ICD-9-CM ICD-10-CM  1. Breast cancer of upper-outer quadrant of left female breast 174.4 C50.412    STAGE: Breast cancer of upper-outer quadrant of left female breast   Primary site: Breast   Staging method: AJCC 7th Edition   Clinical: Stage IA (T1b, N0, cM0) signed by Chauncey Cruel, MD on 02/21/2014  8:53 AM   Summary: Stage IA (T1b, N0, cM0)  HISTORY OF PRESENT ILLNESS::Zoe Boone is a 73 y.o. female  Who was found to have a left breast mass on screening mammogram. Left breast ultrasound confirmed a 7 mm mass in the upper-outer quadrant. This was biopsied and showed an invasive ductal carcinoma, E-cadherin positive, grade 1 or 2, estrogen receptor 99% positive, progesterone receptor 14% positive, with no amplification of HER-2, and with an MIB-1 of 17%. She had a blateral breast MRI earlier this week which showed a signal void in the superior left breast, but no abnormal enhancement. No abnormal appearing lymph nodes. No abnormalities in the contralateral breast. Breast conservation was recommended by Dr. Brantley Stage and she has seen Dr. Jana Hakim earlier today. She is accompanied by her husband. She had mnarche age 38, first live birth age 43. The patient is GX P2. She underwent abdominal hysterectomy and subsequent bilateral salpingo-oophorectomy at age 20. She took hormone replacement for approximately 10 years but has not taken it in many years. She has minimal pain from her biopsy. She is concerned about a "hard knot" she feels in the medial aspect of her breast.   PREVIOUS RADIATION THERAPY: No  PAST MEDICAL HISTORY:  has a past medical history of Anxiety; Hypertension; Cancer [phone]); GERD (gastroesophageal reflux disease); Hyperlipidemia; and  Thyroid disease.    PAST SURGICAL HISTORY: Past Surgical History  Procedure Laterality Date  . Abdominal hysterectomy    . Breast surgery    . Bladder aspiration    . Cataract extraction      bilat.   . Colonoscopy  2014  . Knee arthroscopy  2010    right     FAMILY HISTORY:  Family History  Problem Relation Age of Onset  . Leukemia Father   . Diabetes Father   . Cancer Father     luekemia  . Skin cancer Mother   . Hypertension Mother   . Kidney cancer Brother   . Melanoma Brother     SOCIAL HISTORY:  History  Substance Use Topics  . Smoking status: Never Smoker   . Smokeless tobacco: Not on file  . Alcohol Use: No    ALLERGIES: Aspirin  MEDICATIONS:  Current Outpatient Prescriptions  Medication Sig Dispense Refill  . ALPRAZolam (XANAX) 0.25 MG tablet Take 0.25 mg by mouth at bedtime as needed for anxiety.      . benazepril (LOTENSIN) 20 MG tablet Take 20 mg by mouth daily.      . Cholecalciferol 1000 UNITS capsule Take 1,000 Units by mouth daily.      Marland Kitchen levothyroxine (SYNTHROID, LEVOTHROID) 75 MCG tablet Take 75 mcg by mouth daily before breakfast.      . metoprolol succinate (TOPROL-XL) 25 MG 24 hr tablet Take 25 mg by mouth daily.      . Multiple Vitamin (MULTIVITAMIN) tablet Take 1 tablet by mouth daily.      Marland Kitchen  nitroGLYCERIN (NITROSTAT) 0.4 MG SL tablet Place 0.4 mg under the tongue every 5 (five) minutes as needed for chest pain.      . pravastatin (PRAVACHOL) 40 MG tablet Take 40 mg by mouth daily.      . temazepam (RESTORIL) 15 MG capsule Take 15 mg by mouth at bedtime as needed for sleep.      . valACYclovir (VALTREX) 500 MG tablet Take 500 mg by mouth 2 (two) times daily.       No current facility-administered medications for this encounter.    REVIEW OF SYSTEMS:  A 15 point review of systems is documented in the electronic medical record. This was obtained by the nursing staff. However, I reviewed this with the patient to discuss relevant findings and  make appropriate changes.  Pertinent items are noted in HPI.  PHYSICAL EXAM:  Filed Vitals:   02/21/14 1104  BP: 188/96  Pulse: 57  Temp: 98.6 F (37 C)  .119 lb (53.978 kg). Pleasant female in no distress. Thin. Appears younger than stated age. Palpable "knot" corresponds to a rib. Biopsy site healing well. Small breasts bilaterally. No palpable lymphadenopathy.   LABORATORY DATA:  Lab Results  Component Value Date   WBC 4.7 01/01/2008   HGB 12.9 01/01/2008   HCT 37.8 01/01/2008   MCV 96.2 01/01/2008   PLT 261 01/01/2008   Lab Results  Component Value Date   NA 137 01/01/2008   K 4.4 01/01/2008   CL 102 01/01/2008   CO2 30 01/01/2008   Lab Results  Component Value Date   ALT 17 01/01/2008   AST 22 01/01/2008   ALKPHOS 47 01/01/2008   BILITOT 0.6 01/01/2008     RADIOGRAPHY: Mr Breast Bilateral W Wo Contrast  02/19/2014   CLINICAL DATA:  Recently diagnosed left breast invasive mammary carcinoma. Preoperative evaluation.  LABS:  BUN and creatinine were obtained on site at [facility] at  [address]  Results:  BUN 11 mg/dL,  Creatinine 0.8 mg/dL.  EXAM: BILATERAL BREAST MRI WITH AND WITHOUT CONTRAST  TECHNIQUE: Multiplanar, multisequence MR images of both breasts were obtained prior to and following the intravenous administration of 43m of MultiHance  THREE-DIMENSIONAL MR IMAGE RENDERING ON INDEPENDENT WORKSTATION:  Three-dimensional MR images were rendered by post-processing of the original MR data on an independent workstation. The three-dimensional MR images were interpreted, and findings are reported in the following complete MRI report for this study. Three dimensional images were evaluated at the independent DynaCad workstation  [facility]:  [facility]. Also, comparison previous left breast ultrasound dated 01/14/2014.  FINDINGS: Breast composition: c:  Heterogeneous  fibroglandular tissue  Background parenchymal enhancement: Mild  Right breast: No mass or abnormal enhancement.  Left breast: There is a signal void located within the superior left breast related to the patient's recent left breast ultrasound guided biopsy. There is no abnormal enhancement seen in this region in the area of the patient's known malignancy. There is a smaller signal void located within the inferior left breast related to a previous benign ultrasound-guided core biopsy. There is no abnormal enhancement within the left breast.  Lymph nodes: No abnormal appearing lymph nodes.  Ancillary findings:  None.  IMPRESSION: No abnormal enhancement associated the known malignancy within the superior left breast. There is no evidence for adenopathy and there are no additional findings.  RECOMMENDATION: Treatment plan.  BI-RADS CATEGORY  6: Known biopsy-proven malignancy.   Electronically Signed  By: Luberta Robertson M.D.   On: 02/19/2014 15:18      IMPRESSION: T1N0 Invasive Ductal Carcinoma of the Left Breast  PLAN: I spoke to the patient today regarding her diagnosis and options for treatment. We discussed the equivalence in terms of survival and local failure between mastectomy and breast conservation.  We discussed the role of radiation and decreasing local failures in patients who undergo lumpectomy.We discussed the possible side effects including but not limited to asymptomatic rib, heart and lung damage, heart disease, skin redness, fatigue, permanent skin darkening, and chest wall swelling. We discussed the process of simulation and the placement tattoos. We discussed the low likelihood of secondary malignancies.    We discussed the results of her MRI.   She desires treatment with radiation in [place] and will need to be referred to my partner, Dr. Orlene Erm after surgery.   She will receive antiestrogen treatment from Dr. Jana Hakim after radiation is complete.   I spent 40 minutes  face to face  with the patient and more than 50% of that time was spent in counseling and/or coordination of care.   ------------------------------------------------  Thea Silversmith, MD

## 2014-02-22 ENCOUNTER — Encounter (INDEPENDENT_AMBULATORY_CARE_PROVIDER_SITE_OTHER): Payer: Self-pay | Admitting: Surgery

## 2014-02-22 ENCOUNTER — Ambulatory Visit (INDEPENDENT_AMBULATORY_CARE_PROVIDER_SITE_OTHER): Payer: Medicare Other | Admitting: Surgery

## 2014-02-22 VITALS — BP 126/78 | HR 73 | Temp 97.5°F | Ht 62.0 in | Wt 122.0 lb

## 2014-02-22 DIAGNOSIS — C50912 Malignant neoplasm of unspecified site of left female breast: Secondary | ICD-10-CM

## 2014-02-22 DIAGNOSIS — C50919 Malignant neoplasm of unspecified site of unspecified female breast: Secondary | ICD-10-CM

## 2014-02-22 NOTE — Patient Instructions (Signed)
Sentinel Lymph Node Biopsy Sentinel lymph node biopsy is a procedure in which a single lymph node is identified, removed, and examined for cancer. Lymph nodes are collections of tissue that help filter infections, cancer cells, and other waste substances from the bloodstream. Certain types of cancer can spread to nearby lymph nodes. The cancer spreads to one lymph node first, and then to others. The first lymph node that your cancer could spread to is called the sentinel lymph node. Examining the sentinel lymph node for cancer can help your caregiver plan future treatment for you. LET YOUR CAREGIVER KNOW ABOUT:   Allergies to food or medicine.  Medicines taken, including vitamins, herbs, eyedrops, over-the-counter medicines, and creams.  Use of steroids (by mouth or creams).  Previous problems with numbing medicines.  History of bleeding problems or blood clots.  Previous surgery.  Other health problems, including diabetes and kidney problems.  Possibility of pregnancy, if this applies. RISKS AND COMPLICATIONS   Infection.  Bleeding.  Allergic reaction to the dye used for the procedure.  Blue staining of the skin where the dye is injected.  Damaged lymph vessels, causing a buildup of fluid (lymphedema).  Pain or bruising at the biopsy site. BEFORE THE PROCEDURE   Stop smoking at least 2 weeks before the procedure. Not smoking will improve your health after the procedure and decrease the chance of getting a wound infection.  You may have blood tests to make sure your blood clots normally.  Ask your caregiver about changing or stopping your regular medicines.  Do not eat or drink anything for 8 hours before the procedure. PROCEDURE   You will be given medicine that makes you sleep (general anesthetic).  A blue, radioactive dye will be injected near the tumor. The dye will then spread into the sentinel lymph node.  A scanner will identify the sentinel lymph node.  A  small cut (incision) will be made, and the sentinel lymph node will be removed.  The sentinel lymph node will be examined in a lab. Sometimes, a sentinel lymph node biopsy is performed during another surgery, such as a mastectomy or lumpectomy for breast cancer.  AFTER THE PROCEDURE   You will go to a recovery room.  You will be monitored for several hours.  If complications do not occur, you will be allowed to go home a few hours after the procedure.  Your urine may be blue for the next 24 hours. This is normal. It is caused by the dye used during the procedure.  Your skin where the dye was injected may be blue for up to 8 weeks. Document Released: 09/13/2011 Document Reviewed: 08/10/2013 [facility] Patient Information 2015 [place]. This information is not intended to replace advice given to you by your health care provider. Make sure you discuss any questions you have with your health care provider. Lumpectomy A lumpectomy is a form of "breast conserving" or "breast preservation" surgery. It may also be referred to as a partial mastectomy. During a lumpectomy, the portion of the breast that contains the cancerous tumor or breast mass (the lump) is removed. Some normal tissue around the lump may also be removed to make sure all of the tumor has been removed.  LET [facility] CARE PROVIDER KNOW ABOUT:  Any allergies you have.  All medicines you are taking, including vitamins, herbs, eye drops, creams, and over-the-counter medicines.  Previous problems you or members of your family have had with the use of anesthetics.  Any blood  disorders you have.  Previous surgeries you have had.  Medical conditions you have. RISKS AND COMPLICATIONS Generally, this is a safe procedure. However, problems can occur and include:  Bleeding.  Infection.  Pain.  Temporary swelling.  Change in the shape of the breast, particularly if a large portion is removed. BEFORE THE PROCEDURE  Ask  your health care provider about changing or stopping your regular medicines. This is especially important if you are taking diabetes medicines or blood thinners.  Do not eat or drink anything after midnight on the night before the procedure or as directed by your health care provider. Ask your health care provider if you can take a sip of water with any approved medicines.  On the day of surgery, your health care provider will use a mammogram or ultrasound to locate and mark the tumor in your breast. These markings on your breast will show where the cut (incision) will be made. PROCEDURE   An IV tube will be put into one of your veins.  You may be given medicine to help you relax before the surgery (sedative). You will be given one of the following:  A medicine that numbs the area (local anesthetic).  A medicine that makes you fall asleep (general anesthetic).  Your health care provider will use a kind of electric scalpel that uses heat to minimize bleeding (electrocautery knife).  A curved incision (like a smile or frown) that follows the natural curve of your breast is made, to allow for minimal scarring and better healing.  The tumor will be removed with some of the surrounding tissue. This will be sent to the lab for analysis. Your health care provider may also remove your lymph nodes at this time if needed.  Sometimes, but not always, a rubber tube called a drain will be surgically inserted into your breast area or armpit to collect excess fluid that may accumulate in the space where the tumor was. This drain is connected to a plastic bulb on the outside of your body. This drain creates suction to help remove the fluid.  The incisions will be closed with stitches (sutures).  A bandage may be placed over the incisions. AFTER THE PROCEDURE  You will be taken to the recovery area.  You will be given medicine for pain.  A small rubber drain may be placed in the breast for 2-3 days to  prevent a collection of blood (hematoma) from developing in the breast. You will be given instructions on caring for the drain before you go home.  A pressure bandage (dressing) will be applied for 1-2 days to prevent bleeding. Ask your health care provider how to care for your bandage at home. Document Released: 08/02/2006 Document Revised: 11/05/2013 Document Reviewed: 11/24/2012 [facility] Patient Information 2015 [place], [place]. This information is not intended to replace advice given to you by your health care provider. Make sure you discuss any questions you have with your health care provider.

## 2014-02-22 NOTE — Progress Notes (Signed)
Patient ID: Zoe Boone, female   DOB: 03/09/1941, 73 y.o.   MRN: [id number]  Chief Complaint  Patient presents with  . Breast Cancer Long Term Follow Up    HPI Zoe Boone is a 73 y.o. female.   HPI Patient presents from  the [facility] for mammographic abnormality detected on screening mammogram. 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 The patient has had bilateral breast biopsies many years ago. She is a remote diagnosis of atypical ductal hyperplasia. No significant family history of breast cancer.MR shows bx changes left breast but nothing else.  Past Medical History  Diagnosis Date  . Anxiety   . Hypertension   . Cancer 82015    left breast mammary carcinoma  . GERD (gastroesophageal reflux disease)   . Hyperlipidemia   . Thyroid disease     Past Surgical History  Procedure Laterality Date  . Abdominal hysterectomy    . Breast surgery    . Bladder aspiration    . Cataract extraction      bilat.   . Colonoscopy  2014  . Knee arthroscopy  2010    right     Family History  Problem Relation Age of Onset  . Leukemia Father   . Diabetes Father   . Cancer Father     luekemia  . Skin cancer Mother   . Hypertension Mother   . Kidney cancer Brother   . Melanoma Brother     Social History History  Substance Use Topics  . Smoking status: Never Smoker   . Smokeless tobacco: Not on file  . Alcohol Use: No    Allergies  Allergen Reactions  . Aspirin     Current Outpatient Prescriptions  Medication Sig Dispense Refill  . ALPRAZolam (XANAX) 0.25 MG tablet Take 0.25 mg by mouth at bedtime as needed for anxiety.      . benazepril (LOTENSIN) 20 MG tablet Take 20 mg by mouth daily.      . Cholecalciferol 1000 UNITS capsule Take 1,000 Units by mouth daily.      . levothyroxine (SYNTHROID, LEVOTHROID) 75 MCG tablet Take 75 mcg by mouth daily before breakfast.       . metoprolol succinate (TOPROL-XL) 25 MG 24 hr tablet Take 25 mg by mouth daily.      . Multiple Vitamin (MULTIVITAMIN) tablet Take 1 tablet by mouth daily.      . nitroGLYCERIN (NITROSTAT) 0.4 MG SL tablet Place 0.4 mg under the tongue every 5 (five) minutes as needed for chest pain.      . pravastatin (PRAVACHOL) 40 MG tablet Take 40 mg by mouth daily.      . temazepam (RESTORIL) 15 MG capsule Take 15 mg by mouth at bedtime as needed for sleep.      . valACYclovir (VALTREX) 500 MG tablet Take 500 mg by mouth 2 (two) times daily.       No current facility-administered medications for this visit.    Review of Systems Review of Systems  Constitutional: Negative for fever, chills and unexpected weight change.  HENT: Negative for congestion, hearing loss, sore throat, trouble swallowing and voice change.   Eyes: Negative for visual disturbance.  Respiratory: Negative for cough and wheezing.   Cardiovascular: Negative for chest pain, palpitations and leg swelling.  Gastrointestinal: Negative for nausea, vomiting, abdominal pain, diarrhea, constipation, blood in stool, abdominal distention and anal bleeding.    Genitourinary: Negative for hematuria, vaginal bleeding and difficulty urinating.  Musculoskeletal: Negative for arthralgias.  Skin: Negative for rash and wound.  Neurological: Negative for seizures, syncope and headaches.  Hematological: Negative for adenopathy. Does not bruise/bleed easily.  Psychiatric/Behavioral: Negative for confusion.    Blood pressure 126/78, pulse 73, temperature 97.5 F (36.4 C), height 5' 2" (1.575 m), weight 122 lb (55.339 kg).  Physical Exam Physical Exam  Constitutional: She is oriented to person, place, and time. She appears well-developed and well-nourished.  HENT:  Head: Normocephalic and atraumatic.  Eyes: Pupils are equal, round, and reactive to light. No scleral icterus.  Neck: Normal range of motion.  Cardiovascular: Normal rate and  regular rhythm.   Pulmonary/Chest: Right breast exhibits no inverted nipple, no mass, no nipple discharge, no skin change and no tenderness. Left breast exhibits no inverted nipple, no mass, no nipple discharge, no skin change and no tenderness. Breasts are symmetrical.    Musculoskeletal: Normal range of motion.  Lymphadenopathy:    She has no cervical adenopathy.  Neurological: She is alert and oriented to person, place, and time.  Skin: Skin is warm and dry.  Psychiatric: She has a normal mood and affect. Her behavior is normal. Judgment and thought content normal.    Data Reviewed CLINICAL DATA: Recently diagnosed left breast invasive mammary  carcinoma. Preoperative evaluation.  LABS: BUN and creatinine were obtained on site at [facility] at  [address]  Results: BUN 11 mg/dL, Creatinine 0.8 mg/dL.  EXAM:  BILATERAL BREAST MRI WITH AND WITHOUT CONTRAST  TECHNIQUE:  Multiplanar, multisequence MR images of both breasts were obtained  prior to and following the intravenous administration of 11ml of  MultiHance  THREE-DIMENSIONAL MR IMAGE RENDERING ON INDEPENDENT WORKSTATION:  Three-dimensional MR images were rendered by post-processing of the  original MR data on an independent workstation. The  three-dimensional MR images were interpreted, and findings are  reported in the following complete MRI report for this study. Three  dimensional images were evaluated at the independent DynaCad  workstation  COMPARISON: Previous examinations from [facility] including  mammography dated 01/16/2014, 01/07/2014, 01/03/2013. Also,  comparison previous left breast ultrasound dated 01/14/2014.  FINDINGS:  Breast composition: c: Heterogeneous fibroglandular tissue  Background parenchymal enhancement: Mild  Right breast: No mass or abnormal enhancement.  Left breast: There is a signal void located within the superior left  breast related to the patient's recent left  breast ultrasound guided  biopsy. There is no abnormal enhancement seen in this region in the  area of the patient's known malignancy. There is a smaller signal  void located within the inferior left breast related to a previous  benign ultrasound-guided core biopsy. There is no abnormal  enhancement within the left breast.  Lymph nodes: No abnormal appearing lymph nodes.  Ancillary findings: None.  IMPRESSION:  No abnormal enhancement associated the known malignancy within the  superior left breast. There is no evidence for adenopathy and there  are no additional findings.  RECOMMENDATION:  Treatment plan.  BI-RADS CATEGORY 6: Known biopsy-proven malignancy.  Electronically Signed  By: Randy Jackson M.D.  On: 02/19/2014 15:18 Mammogram ultrasound reviewed. 7 mm mass left upper-outer quadrant core biopsy proven to be invasive ductal carcinoma. HER-2/neu negative ER and PR receptors pending  Assessment    Stage I left breast cancer  Very dense breast tissue on exam    Plan    Pt has noother abnormalities on MR. Recommend breast conservation with left breast   seed localized lumpectomy and SLN mapping. The procedure has been discussed with the patient. Alternatives to surgery have been discussed with the patient.  Risks of surgery include bleeding,  Infection,  Seroma formation, death,  and the need for further surgery.   The patient understands and wishes to proceed.Sentinel lymph node mapping and dissection has been discussed with the patient.  Risk of bleeding,  Infection,  Seroma formation,  Additional procedures,,  Shoulder weakness ,  Shoulder stiffness,  Nerve and blood vessel injury and reaction to the mapping dyes have been discussed.  Alternatives to surgery have been discussed with the patient.  The patient agrees to proceed.       Addeline Calarco A. 02/22/2014, 12:31 PM

## 2014-03-04 ENCOUNTER — Other Ambulatory Visit (INDEPENDENT_AMBULATORY_CARE_PROVIDER_SITE_OTHER): Payer: Self-pay | Admitting: Surgery

## 2014-03-04 DIAGNOSIS — C50912 Malignant neoplasm of unspecified site of left female breast: Secondary | ICD-10-CM

## 2014-03-14 ENCOUNTER — Encounter (HOSPITAL_BASED_OUTPATIENT_CLINIC_OR_DEPARTMENT_OTHER): Payer: Self-pay | Admitting: *Deleted

## 2014-03-14 NOTE — Progress Notes (Signed)
Pt very nervous-went through all care from seeds to surgery-all questions answered was able to review instructions-sees dr Bettina Gavia for htn control-called for notes

## 2014-03-19 ENCOUNTER — Encounter (HOSPITAL_BASED_OUTPATIENT_CLINIC_OR_DEPARTMENT_OTHER)
Admission: RE | Admit: 2014-03-19 | Discharge: 2014-03-19 | Disposition: A | Discharge status: Home / Self Care | Payer: Medicare Other | Source: Ambulatory Visit | Attending: Surgery | Admitting: Surgery

## 2014-03-19 ENCOUNTER — Ambulatory Visit
Admission: RE | Admit: 2014-03-19 | Discharge: 2014-03-19 | Disposition: A | Discharge status: Home / Self Care | Payer: Medicare Other | Source: Ambulatory Visit | Attending: Surgery | Admitting: Surgery

## 2014-03-19 DIAGNOSIS — E785 Hyperlipidemia, unspecified: Secondary | ICD-10-CM | POA: Diagnosis not present

## 2014-03-19 DIAGNOSIS — C50919 Malignant neoplasm of unspecified site of unspecified female breast: Secondary | ICD-10-CM | POA: Diagnosis not present

## 2014-03-19 DIAGNOSIS — Z79899 Other long term (current) drug therapy: Secondary | ICD-10-CM | POA: Diagnosis not present

## 2014-03-19 DIAGNOSIS — K219 Gastro-esophageal reflux disease without esophagitis: Secondary | ICD-10-CM | POA: Diagnosis not present

## 2014-03-19 DIAGNOSIS — F411 Generalized anxiety disorder: Secondary | ICD-10-CM | POA: Diagnosis not present

## 2014-03-19 DIAGNOSIS — E079 Disorder of thyroid, unspecified: Secondary | ICD-10-CM | POA: Diagnosis not present

## 2014-03-19 DIAGNOSIS — C50912 Malignant neoplasm of unspecified site of left female breast: Secondary | ICD-10-CM

## 2014-03-19 DIAGNOSIS — Z886 Allergy status to analgesic agent status: Secondary | ICD-10-CM | POA: Diagnosis not present

## 2014-03-19 DIAGNOSIS — I10 Essential (primary) hypertension: Secondary | ICD-10-CM | POA: Diagnosis not present

## 2014-03-19 LAB — COMPREHENSIVE METABOLIC PANEL
ALT: 17 U/L (ref 0–35)
ANION GAP: 11 (ref 5–15)
AST: 25 U/L (ref 0–37)
Albumin: 4.4 g/dL (ref 3.5–5.2)
Alkaline Phosphatase: 75 U/L (ref 39–117)
BUN: 17 mg/dL (ref 6–23)
CO2: 29 mEq/L (ref 19–32)
Calcium: 10.1 mg/dL (ref 8.4–10.5)
Chloride: 100 mEq/L (ref 96–112)
Creatinine, Ser: 0.74 mg/dL (ref 0.50–1.10)
GFR calc Af Amer: >90 mL/min (ref >90)
GFR calc non Af Amer: 84 mL/min — ABNORMAL LOW (ref >90)
GLUCOSE: 89 mg/dL (ref 70–99)
Potassium: 4.5 mEq/L (ref 3.7–5.3)
Sodium: 140 mEq/L (ref 137–147)
Total Bilirubin: 0.2 mg/dL — ABNORMAL LOW (ref 0.3–1.2)
Total Protein: 7.5 g/dL (ref 6.0–8.3)

## 2014-03-19 LAB — CBC WITH DIFFERENTIAL/PLATELET
BASOS ABS: 0 10*3/uL (ref 0.0–0.1)
Basophils Relative: 1 % (ref 0–1)
Eosinophils Absolute: 0.1 10*3/uL (ref 0.0–0.7)
Eosinophils Relative: 2 % (ref 0–5)
HCT: 39.9 % (ref 36.0–46.0)
Hemoglobin: 13.4 g/dL (ref 12.0–15.0)
Lymphocytes Relative: 27 % (ref 12–46)
Lymphs Abs: 1.5 10*3/uL (ref 0.7–4.0)
MCH: 31.5 pg (ref 26.0–34.0)
MCHC: 33.6 g/dL (ref 30.0–36.0)
MCV: 93.7 fL (ref 78.0–100.0)
Monocytes Absolute: 0.4 10*3/uL (ref 0.1–1.0)
Monocytes Relative: 7 % (ref 3–12)
Neutro Abs: 3.6 10*3/uL (ref 1.7–7.7)
Neutrophils Relative %: 63 % (ref 43–77)
Platelets: 301 10*3/uL (ref 150–400)
RBC: 4.26 MIL/uL (ref 3.87–5.11)
RDW: 12.3 % (ref 11.5–15.5)
WBC: 5.7 10*3/uL (ref 4.0–10.5)

## 2014-03-21 ENCOUNTER — Encounter (HOSPITAL_COMMUNITY)
Admission: RE | Admit: 2014-03-21 | Discharge: 2014-03-21 | Disposition: A | Discharge status: Home / Self Care | Payer: Medicare Other | Source: Ambulatory Visit | Attending: Surgery | Admitting: Surgery

## 2014-03-21 ENCOUNTER — Encounter (HOSPITAL_BASED_OUTPATIENT_CLINIC_OR_DEPARTMENT_OTHER): Payer: Self-pay | Admitting: Certified Registered"

## 2014-03-21 ENCOUNTER — Ambulatory Visit (HOSPITAL_BASED_OUTPATIENT_CLINIC_OR_DEPARTMENT_OTHER): Payer: Medicare Other | Admitting: Certified Registered"

## 2014-03-21 ENCOUNTER — Encounter (HOSPITAL_BASED_OUTPATIENT_CLINIC_OR_DEPARTMENT_OTHER)
Admission: RE | Disposition: A | Discharge status: Home / Self Care | Payer: Self-pay | Source: Ambulatory Visit | Attending: Surgery

## 2014-03-21 ENCOUNTER — Ambulatory Visit (HOSPITAL_BASED_OUTPATIENT_CLINIC_OR_DEPARTMENT_OTHER)
Admission: RE | Admit: 2014-03-21 | Discharge: 2014-03-21 | Disposition: A | Discharge status: Home / Self Care | Payer: Medicare Other | Source: Ambulatory Visit | Attending: Surgery | Admitting: Surgery

## 2014-03-21 ENCOUNTER — Encounter (HOSPITAL_BASED_OUTPATIENT_CLINIC_OR_DEPARTMENT_OTHER): Payer: Medicare Other | Admitting: Certified Registered"

## 2014-03-21 ENCOUNTER — Ambulatory Visit
Admission: RE | Admit: 2014-03-21 | Discharge: 2014-03-21 | Disposition: A | Discharge status: Home / Self Care | Payer: Medicare Other | Source: Ambulatory Visit | Attending: Surgery | Admitting: Surgery

## 2014-03-21 DIAGNOSIS — Z79899 Other long term (current) drug therapy: Secondary | ICD-10-CM | POA: Insufficient documentation

## 2014-03-21 DIAGNOSIS — K219 Gastro-esophageal reflux disease without esophagitis: Secondary | ICD-10-CM | POA: Insufficient documentation

## 2014-03-21 DIAGNOSIS — C50919 Malignant neoplasm of unspecified site of unspecified female breast: Secondary | ICD-10-CM | POA: Insufficient documentation

## 2014-03-21 DIAGNOSIS — Z886 Allergy status to analgesic agent status: Secondary | ICD-10-CM | POA: Insufficient documentation

## 2014-03-21 DIAGNOSIS — C50912 Malignant neoplasm of unspecified site of left female breast: Secondary | ICD-10-CM

## 2014-03-21 DIAGNOSIS — E079 Disorder of thyroid, unspecified: Secondary | ICD-10-CM | POA: Insufficient documentation

## 2014-03-21 DIAGNOSIS — F411 Generalized anxiety disorder: Secondary | ICD-10-CM | POA: Diagnosis not present

## 2014-03-21 DIAGNOSIS — I10 Essential (primary) hypertension: Secondary | ICD-10-CM | POA: Diagnosis not present

## 2014-03-21 DIAGNOSIS — E785 Hyperlipidemia, unspecified: Secondary | ICD-10-CM | POA: Insufficient documentation

## 2014-03-21 HISTORY — DX: Unspecified osteoarthritis, unspecified site: M19.90

## 2014-03-21 HISTORY — DX: Nausea with vomiting, unspecified: Z98.890

## 2014-03-21 HISTORY — DX: Nausea with vomiting, unspecified: R11.2

## 2014-03-21 SURGERY — RADIOACTIVE SEED GUIDED PARTIAL MASTECTOMY WITH AXILLARY SENTINEL LYMPH NODE BIOPSY
Anesthesia: Regional | Laterality: Left

## 2014-03-21 MED ORDER — CHLORHEXIDINE GLUCONATE 4 % EX LIQD: 1.0000 "application " | Freq: Once | CUTANEOUS | Status: DC

## 2014-03-21 MED ORDER — METHYLENE BLUE 1 % INJ SOLN
INTRAMUSCULAR | Status: AC
  Filled 2014-03-21: qty 10

## 2014-03-21 MED ORDER — DEXAMETHASONE SODIUM PHOSPHATE 4 MG/ML IJ SOLN
INTRAMUSCULAR | Status: DC | PRN
  Administered 2014-03-21: 10 mg via INTRAVENOUS

## 2014-03-21 MED ORDER — BUPIVACAINE-EPINEPHRINE (PF) 0.5% -1:200000 IJ SOLN
INTRAMUSCULAR | Status: DC | PRN
  Administered 2014-03-21: 25 mL via PERINEURAL

## 2014-03-21 MED ORDER — ONDANSETRON HCL 4 MG/2ML IJ SOLN: 4.0000 mg | Freq: Four times a day (QID) | INTRAMUSCULAR | Status: DC | PRN

## 2014-03-21 MED ORDER — HYDROMORPHONE HCL 1 MG/ML IJ SOLN
0.2500 mg | INTRAMUSCULAR | Status: DC | PRN
  Administered 2014-03-21: 0.5 mg via INTRAVENOUS

## 2014-03-21 MED ORDER — 0.9 % SODIUM CHLORIDE (POUR BTL) OPTIME
TOPICAL | Status: DC | PRN
  Administered 2014-03-21: 1000 mL

## 2014-03-21 MED ORDER — SODIUM CHLORIDE 0.9 % IJ SOLN
INTRAMUSCULAR | Status: AC
  Filled 2014-03-21: qty 10

## 2014-03-21 MED ORDER — PROPOFOL 10 MG/ML IV BOLUS
INTRAVENOUS | Status: DC | PRN
  Administered 2014-03-21: 150 mg via INTRAVENOUS

## 2014-03-21 MED ORDER — EPHEDRINE SULFATE 50 MG/ML IJ SOLN
INTRAMUSCULAR | Status: DC | PRN
  Administered 2014-03-21 (×2): 5 mg via INTRAVENOUS

## 2014-03-21 MED ORDER — OXYCODONE HCL 5 MG/5ML PO SOLN: 5.0000 mg | Freq: Once | ORAL | Status: DC | PRN

## 2014-03-21 MED ORDER — PROPOFOL 10 MG/ML IV BOLUS
INTRAVENOUS | Status: AC
  Filled 2014-03-21: qty 20

## 2014-03-21 MED ORDER — FENTANYL CITRATE 0.05 MG/ML IJ SOLN
50.0000 ug | INTRAMUSCULAR | Status: DC | PRN
  Administered 2014-03-21 (×2): 50 ug via INTRAVENOUS

## 2014-03-21 MED ORDER — CEFAZOLIN SODIUM-DEXTROSE 2-3 GM-% IV SOLR
INTRAVENOUS | Status: AC
  Filled 2014-03-21: qty 50

## 2014-03-21 MED ORDER — CEFAZOLIN SODIUM-DEXTROSE 2-3 GM-% IV SOLR
2.0000 g | INTRAVENOUS | Status: AC
  Administered 2014-03-21: 2 g via INTRAVENOUS

## 2014-03-21 MED ORDER — HYDROCODONE-ACETAMINOPHEN 5-325 MG PO TABS: 1.0000 | ORAL_TABLET | Freq: Four times a day (QID) | ORAL | Status: DC | PRN

## 2014-03-21 MED ORDER — MIDAZOLAM HCL 2 MG/2ML IJ SOLN
1.0000 mg | INTRAMUSCULAR | Status: DC | PRN
  Administered 2014-03-21 (×2): 1 mg via INTRAVENOUS

## 2014-03-21 MED ORDER — SODIUM CHLORIDE 0.9 % IJ SOLN
INTRAMUSCULAR | Status: DC | PRN
  Administered 2014-03-21: 12:00:00

## 2014-03-21 MED ORDER — BUPIVACAINE-EPINEPHRINE (PF) 0.25% -1:200000 IJ SOLN
INTRAMUSCULAR | Status: DC | PRN
  Administered 2014-03-21: 5 mL

## 2014-03-21 MED ORDER — FENTANYL CITRATE 0.05 MG/ML IJ SOLN
INTRAMUSCULAR | Status: AC
  Filled 2014-03-21: qty 2

## 2014-03-21 MED ORDER — ONDANSETRON HCL 4 MG/2ML IJ SOLN
INTRAMUSCULAR | Status: DC | PRN
  Administered 2014-03-21: 4 mg via INTRAVENOUS

## 2014-03-21 MED ORDER — FENTANYL CITRATE 0.05 MG/ML IJ SOLN
INTRAMUSCULAR | Status: AC
  Filled 2014-03-21: qty 4

## 2014-03-21 MED ORDER — OXYCODONE HCL 5 MG PO TABS: 5.0000 mg | ORAL_TABLET | Freq: Once | ORAL | Status: DC | PRN

## 2014-03-21 MED ORDER — LACTATED RINGERS IV SOLN
INTRAVENOUS | Status: DC
  Administered 2014-03-21 (×2): via INTRAVENOUS

## 2014-03-21 MED ORDER — LIDOCAINE HCL (CARDIAC) 20 MG/ML IV SOLN
INTRAVENOUS | Status: DC | PRN
  Administered 2014-03-21: 80 mg via INTRAVENOUS

## 2014-03-21 MED ORDER — MIDAZOLAM HCL 2 MG/2ML IJ SOLN
INTRAMUSCULAR | Status: AC
  Filled 2014-03-21: qty 2

## 2014-03-21 MED ORDER — TECHNETIUM TC 99M SULFUR COLLOID FILTERED
1.0000 | Freq: Once | INTRAVENOUS | Status: AC | PRN
  Administered 2014-03-21: 1 via INTRADERMAL

## 2014-03-21 MED ORDER — HYDROMORPHONE HCL 1 MG/ML IJ SOLN
INTRAMUSCULAR | Status: AC
  Filled 2014-03-21: qty 1

## 2014-03-21 SURGICAL SUPPLY — 56 items
ADH SKN CLS APL DERMABOND .7 (GAUZE/BANDAGES/DRESSINGS) ×1
APPLIER CLIP 9.375 MED OPEN (MISCELLANEOUS)
APR CLP MED 9.3 20 MLT OPN (MISCELLANEOUS)
BINDER BREAST LRG (GAUZE/BANDAGES/DRESSINGS) IMPLANT
BINDER BREAST MEDIUM (GAUZE/BANDAGES/DRESSINGS) ×2 IMPLANT
BINDER BREAST XLRG (GAUZE/BANDAGES/DRESSINGS) IMPLANT
BINDER BREAST XXLRG (GAUZE/BANDAGES/DRESSINGS) IMPLANT
BLADE SURG 15 STRL LF DISP TIS (BLADE) ×1 IMPLANT
BLADE SURG 15 STRL SS (BLADE) ×3
CANISTER SUC SOCK COL 7IN (MISCELLANEOUS) IMPLANT
CANISTER SUCT 1200ML W/VALVE (MISCELLANEOUS) ×3 IMPLANT
CHLORAPREP W/TINT 26ML (MISCELLANEOUS) ×3 IMPLANT
CLIP APPLIE 9.375 MED OPEN (MISCELLANEOUS) ×1 IMPLANT
CLIP TI WIDE RED SMALL 6 (CLIP) ×2 IMPLANT
COVER MAYO STAND STRL (DRAPES) ×3 IMPLANT
COVER PROBE W GEL 5X96 (DRAPES) ×3 IMPLANT
COVER TABLE BACK 60X90 (DRAPES) ×3 IMPLANT
DECANTER SPIKE VIAL GLASS SM (MISCELLANEOUS) IMPLANT
DERMABOND ADVANCED (GAUZE/BANDAGES/DRESSINGS) ×2
DERMABOND ADVANCED .7 DNX12 (GAUZE/BANDAGES/DRESSINGS) ×1 IMPLANT
DEVICE DUBIN W/COMP PLATE 8390 (MISCELLANEOUS) ×3 IMPLANT
DRAPE LAPAROSCOPIC ABDOMINAL (DRAPES) ×2 IMPLANT
DRAPE UTILITY XL STRL (DRAPES) ×3 IMPLANT
ELECT COATED BLADE 2.86 ST (ELECTRODE) ×3 IMPLANT
ELECT REM PT RETURN 9FT ADLT (ELECTROSURGICAL) ×3
ELECTRODE REM PT RTRN 9FT ADLT (ELECTROSURGICAL) ×1 IMPLANT
GLOVE BIOGEL PI IND STRL 7.0 (GLOVE) IMPLANT
GLOVE BIOGEL PI IND STRL 8 (GLOVE) ×1 IMPLANT
GLOVE BIOGEL PI INDICATOR 7.0 (GLOVE) ×2
GLOVE BIOGEL PI INDICATOR 8 (GLOVE) ×2
GLOVE ECLIPSE 7.0 STRL STRAW (GLOVE) ×2 IMPLANT
GLOVE ECLIPSE 8.0 STRL XLNG CF (GLOVE) ×5 IMPLANT
GLOVE EXAM NITRILE MD LF STRL (GLOVE) ×2 IMPLANT
GOWN STRL REUS W/ TWL LRG LVL3 (GOWN DISPOSABLE) ×2 IMPLANT
GOWN STRL REUS W/TWL LRG LVL3 (GOWN DISPOSABLE) ×6
KIT MARKER MARGIN INK (KITS) ×3 IMPLANT
NDL HYPO 25X1 1.5 SAFETY (NEEDLE) ×1 IMPLANT
NDL SAFETY ECLIPSE 18X1.5 (NEEDLE) IMPLANT
NEEDLE HYPO 18GX1.5 SHARP (NEEDLE) ×3
NEEDLE HYPO 25X1 1.5 SAFETY (NEEDLE) ×6 IMPLANT
NS IRRIG 1000ML POUR BTL (IV SOLUTION) ×3 IMPLANT
PACK BASIN DAY SURGERY FS (CUSTOM PROCEDURE TRAY) ×3 IMPLANT
PENCIL BUTTON HOLSTER BLD 10FT (ELECTRODE) ×3 IMPLANT
SLEEVE SCD COMPRESS KNEE MED (MISCELLANEOUS) ×3 IMPLANT
SPONGE LAP 4X18 X RAY DECT (DISPOSABLE) ×3 IMPLANT
STAPLER VISISTAT 35W (STAPLE) IMPLANT
SUT MNCRL AB 4-0 PS2 18 (SUTURE) ×5 IMPLANT
SUT SILK 2 0 SH (SUTURE) IMPLANT
SUT VIC AB 3-0 SH 27 (SUTURE) ×9
SUT VIC AB 3-0 SH 27X BRD (SUTURE) ×1 IMPLANT
SYR CONTROL 10ML LL (SYRINGE) ×5 IMPLANT
TOWEL OR 17X24 6PK STRL BLUE (TOWEL DISPOSABLE) ×3 IMPLANT
TOWEL OR NON WOVEN STRL DISP B (DISPOSABLE) ×3 IMPLANT
TUBE CONNECTING 20'X1/4 (TUBING) ×1
TUBE CONNECTING 20X1/4 (TUBING) ×1 IMPLANT
YANKAUER SUCT BULB TIP NO VENT (SUCTIONS) ×2 IMPLANT

## 2014-03-21 NOTE — Interval H&P Note (Signed)
History and Physical Interval Note:  03/21/2014 11:32 AM  Zoe Boone  has presented today for surgery, with the diagnosis of left breast cancer  The various methods of treatment have been discussed with the patient and family. After consideration of risks, benefits and other options for treatment, the patient has consented to  Procedure(s): LEFT BREAST SEED LOCALIZATION LUMPECTOMY LEFT SENTINEL [place] (Left) as a surgical intervention .  The patient's history has been reviewed, patient examined, no change in status, stable for surgery.  I have reviewed the patient's chart and labs.  Questions were answered to the patient's satisfaction.     Graceanne Guin A.

## 2014-03-21 NOTE — Brief Op Note (Signed)
03/21/2014  12:57 PM  PATIENT:  Zoe Boone  73 y.o. female  PRE-OPERATIVE DIAGNOSIS:  Left breast cancer  POST-OPERATIVE DIAGNOSIS:  Left breast cancer  PROCEDURE:  Procedure(s): LEFT BREAST SEED LOCALIZATION LUMPECTOMY LEFT SENTINEL LYMPH NODE MAPPING (Left)  SURGEON:  Surgeon(s) and Role:    * Erroll Luna, MD - Primary      ANESTHESIA:   regional and general  EBL:  Total I/O In: 1000 [I.V.:1000] Out: -   BLOOD ADMINISTERED:none  DRAINS: none   LOCAL MEDICATIONS USED:  BUPIVICAINE   SPECIMEN:  Source of Specimen:  left breast and axilla  DISPOSITION OF SPECIMEN:  PATHOLOGY  COUNTS:  YES  TOURNIQUET:  * No tourniquets in log *  DICTATION: .Other Dictation: Dictation Number [id number]  PLAN OF CARE: Discharge to home after PACU  PATIENT DISPOSITION:  PACU - hemodynamically stable.   Delay start of Pharmacological VTE agent (>24hrs) due to surgical blood loss or risk of bleeding: not applicable

## 2014-03-21 NOTE — Anesthesia Procedure Notes (Addendum)
Anesthesia Regional Block:  Pectoralis block  Pre-Anesthetic Checklist: ,, timeout performed, Correct Patient, Correct Site, Correct Laterality, Correct Procedure, Correct Position, site marked, Risks and benefits discussed,  Surgical consent,  Pre-op evaluation,  At surgeon's request and post-op pain management  Laterality: Left  Prep: chloraprep       Needles:  Injection technique: Single-shot  Needle Type: Echogenic Needle     Needle Length: 9cm 9 cm Needle Gauge: 21 and 21 G    Additional Needles:  Procedures: ultrasound guided (picture in chart) Pectoralis block Narrative:  Start time: 03/21/2014 11:41 AM End time: 03/21/2014 11:52 AM Injection made incrementally with aspirations every 5 mL.  Performed by: Personally  Anesthesiologist: Dr Marcie Bal  Additional Notes: Pt tolerated the procedure well.   Procedure Name: LMA Insertion Date/Time: 03/21/2014 12:09 PM Performed by: Baxter Flattery Pre-anesthesia Checklist: Patient identified, Emergency Drugs available, Suction available and Patient being monitored Patient Re-evaluated:Patient Re-evaluated prior to inductionOxygen Delivery Method: Circle System Utilized Preoxygenation: Pre-oxygenation with 100% oxygen Intubation Type: IV induction Ventilation: Mask ventilation without difficulty LMA: LMA inserted LMA Size: 4.0 Number of attempts: 1 Airway Equipment and Method: bite block Placement Confirmation: positive ETCO2 and breath sounds checked- equal and bilateral Tube secured with: Tape Dental Injury: Teeth and Oropharynx as per pre-operative assessment

## 2014-03-21 NOTE — Op Note (Signed)
NAME:  Alto, Maguadalupe                 ACCOUNT NO.:  [id number]  MEDICAL RECORD NO.:  [id number]  LOCATION:                               FACILITY:  [facility]  PHYSICIAN:  Lilo Wallington A. Starlee Corralejo, M.D.DATE OF BIRTH:  02/28/1941  DATE OF PROCEDURE:  03/21/2014 DATE OF DISCHARGE:  03/21/2014                              OPERATIVE REPORT   PREOPERATIVE DIAGNOSIS:  Stage I left breast cancer.  POSTOPERATIVE DIAGNOSIS:  Stage I left breast cancer.  PROCEDURE: 1. Left breast seed localized lumpectomy. 2. Left axillary sentinel lymph node mapping with methylene blue dye.  SURGEON:  Keion Neels A. Kelly Ranieri, MD.  ANESTHESIA:  LMA with pectoral block for anesthesia and 8 mL of 0.25% Sensorcaine local with epinephrine.  EBL:  Minimal.  SPECIMEN: 1. Left breast mass with both radioactive clip and localizing clip and     specimen to pathology. 2. Left axillary sentinel node blue and hot x1.  DRAINS:  None.  INDICATIONS FOR PROCEDURE:  The patient is a pleasant 73-year-old female with a stage I left breast cancer.  She chose breast conservation over mastectomy.  We discussed the surgery for this.  Risk, benefits, and alternative therapies were discussed.  Risk of bleeding, infection, reaction to dye, cosmetic deformity, need for further surgery, pain, death, DVT, and any further potential treatment were discussed preoperatively.  She understood the above and agreed to proceed.  DESCRIPTION OF PROCEDURE:  The patient was met in the holding area.  She had undergone injection by Nuclear Medicine of the technetium sulfur colloid for mapping.  Neoprobe was used to identify the clip that was in place which it was and the mapping was successful.  Questions were answered.  Left side was marked.  She was taken back to the operating room.  She was then placed supine on the OR table.  After induction of LMA anesthesia, the left breast was prepped with alcohol and 4 mL of methylene blue dye were injected in a  subareolar position.  Prior to this, time-out was done to verify the left side was correct, procedure, and patient.  Left breast was then prepped and draped in sterile fashion.  She received 2 g of Ancef of note.  Neoprobe was used and the mass was identified in the left breast upper outer quadrant.  A curvilinear incision was made along the border of the nipple on the upper outer side of this and dissection was carried down to the seed which was localized  with a Neoprobe.  All tissue was excised around the seed and radiograph revealed both seeds  clipped to be in the specimen. These were sent to pathology and verified as received.  The cavity was irrigated and found to be hemostatic and closed in the deep layer with 3- 0 Vicryl and 4-0 Monocryl subcuticular stitch.  Neoprobe was then used to localize the hot spot in left axilla under technetium settings.  This was identified and local anesthesia was infiltrated in the skin.  An incision was made.  Dissection was carried into the axilla.  A blue and hot node was identified and excised. Background counts were 0.  Irrigation was used and this wound was   closed with 3-0 Vicryl and 4-0 Monocryl.  Dermabond was applied to both. Breast binder applied.  All final counts of sponge, instrument, and needle was found to be correct at this portion of the case.  The patient was then awoke, extubated, taken to recovery in satisfactory condition.     Jerred Zaremba A. Deion Forgue, M.D.     TAC/MEDQ  D:  03/21/2014  T:  03/21/2014  Job:  [id number] 

## 2014-03-21 NOTE — Transfer of Care (Signed)
Immediate Anesthesia Transfer of Care Note  Patient: Zoe Boone  Procedure(s) Performed: Procedure(s): LEFT BREAST SEED LOCALIZATION LUMPECTOMY LEFT SENTINEL LYMPH NODE MAPPING (Left)  Patient Location: 2PACU  Anesthesia Type:GA combined with regional for post-op pain  Level of Consciousness: awake, alert  and patient cooperative  Airway & Oxygen Therapy: Patient Spontanous Breathing and Patient connected to face mask oxygen  Post-op Assessment: Report given to PACU RN, Post -op Vital signs reviewed and stable and Patient moving all extremities  Post vital signs: Reviewed and stable  Complications: No apparent anesthesia complications

## 2014-03-21 NOTE — Op Note (Deleted)
NAMEHAVAH, AMMON NO.:  [id number]  MEDICAL RECORD NO.:  [id number]  LOCATION:                               FACILITY:  [facility]  PHYSICIAN:  Masiah Lewing A. Missi Mcmackin, M.D.DATE OF BIRTH:  30-Jun-1941  DATE OF PROCEDURE:  03/21/2014 DATE OF DISCHARGE:  03/21/2014                              OPERATIVE REPORT   PREOPERATIVE DIAGNOSIS:  Stage I left breast cancer.  POSTOPERATIVE DIAGNOSIS:  Stage I left breast cancer.  PROCEDURE: 1. Left breast seed localized lumpectomy. 2. Left axillary sentinel lymph node mapping with methylene blue dye.  SURGEON:  Marcello Moores A. Julieta Rogalski, MD.  ANESTHESIA:  LMA with pectoral block for anesthesia and 8 mL of 0.25% Sensorcaine local with epinephrine.  EBL:  Minimal.  SPECIMEN: 1. Left breast mass with both radioactive clip and localizing clip and     specimen to pathology. 2. Left axillary sentinel node blue and hot x1.  DRAINS:  None.  INDICATIONS FOR PROCEDURE:  The patient is a pleasant 73 year old female with a stage I left breast cancer.  She chose breast conservation over mastectomy.  We discussed the surgery for this.  Risk, benefits, and alternative therapies were discussed.  Risk of bleeding, infection, reaction to dye, cosmetic deformity, need for further surgery, pain, death, DVT, and any further potential treatment were discussed preoperatively.  She understood the above and agreed to proceed.  DESCRIPTION OF PROCEDURE:  The patient was met in the holding area.  She had undergone injection by Nuclear Medicine of the technetium sulfur colloid for mapping.  Neoprobe was used to identify the clip that was in place which it was and the mapping was successful.  Questions were answered.  Left side was marked.  She was taken back to the operating room.  She was then placed supine on the OR table.  After induction of LMA anesthesia, the left breast was prepped with alcohol and 4 mL of methylene blue dye were injected in a  subareolar position.  Prior to this, time-out was done to verify the left side was correct, procedure, and patient.  Left breast was then prepped and draped in sterile fashion.  She received 2 g of Ancef of note.  Neoprobe was used and the mass was identified in the left breast upper outer quadrant.  A curvilinear incision was made along the border of the nipple on the upper outer side of this and dissection was carried down to the seed which was localized  with a Neoprobe.  All tissue was excised around the seed and radiograph revealed both seeds  clipped to be in the specimen. These were sent to pathology and verified as received.  The cavity was irrigated and found to be hemostatic and closed in the deep layer with 3- 0 Vicryl and 4-0 Monocryl subcuticular stitch.  Neoprobe was then used to localize the hot spot in left axilla under technetium settings.  This was identified and local anesthesia was infiltrated in the skin.  An incision was made.  Dissection was carried into the axilla.  A blue and hot node was identified and excised. Background counts were 0.  Irrigation was used and this wound was  closed with 3-0 Vicryl and 4-0 Monocryl.  Dermabond was applied to both. Breast binder applied.  All final counts of sponge, instrument, and needle was found to be correct at this portion of the case.  The patient was then awoke, extubated, taken to recovery in satisfactory condition.     Kenslee Achorn A. Penda Venturi, M.D.     TAC/MEDQ  D:  03/21/2014  T:  03/21/2014  Job:  [id number]

## 2014-03-21 NOTE — H&P (View-Only) (Signed)
Patient ID: Zoe Boone, female   DOB: 04/17/1941, 73 y.o.   MRN: [id number]  Chief Complaint  Patient presents with  . Breast Cancer Long Term Follow Up    HPI Zoe Boone is a 73 y.o. female.   HPI Patient presents from  the [facility] for mammographic abnormality detected on screening mammogram. 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 The patient has had bilateral breast biopsies many years ago. She is a remote diagnosis of atypical ductal hyperplasia. No significant family history of breast cancer.MR shows bx changes left breast but nothing else.  Past Medical History  Diagnosis Date  . Anxiety   . Hypertension   . Cancer [phone]    left breast mammary carcinoma  . GERD (gastroesophageal reflux disease)   . Hyperlipidemia   . Thyroid disease     Past Surgical History  Procedure Laterality Date  . Abdominal hysterectomy    . Breast surgery    . Bladder aspiration    . Cataract extraction      bilat.   . Colonoscopy  2014  . Knee arthroscopy  2010    right     Family History  Problem Relation Age of Onset  . Leukemia Father   . Diabetes Father   . Cancer Father     luekemia  . Skin cancer Mother   . Hypertension Mother   . Kidney cancer Brother   . Melanoma Brother     Social History History  Substance Use Topics  . Smoking status: Never Smoker   . Smokeless tobacco: Not on file  . Alcohol Use: No    Allergies  Allergen Reactions  . Aspirin     Current Outpatient Prescriptions  Medication Sig Dispense Refill  . ALPRAZolam (XANAX) 0.25 MG tablet Take 0.25 mg by mouth at bedtime as needed for anxiety.      . benazepril (LOTENSIN) 20 MG tablet Take 20 mg by mouth daily.      . Cholecalciferol 1000 UNITS capsule Take 1,000 Units by mouth daily.      Marland Kitchen levothyroxine (SYNTHROID, LEVOTHROID) 75 MCG tablet Take 75 mcg by mouth daily before breakfast.       . metoprolol succinate (TOPROL-XL) 25 MG 24 hr tablet Take 25 mg by mouth daily.      . Multiple Vitamin (MULTIVITAMIN) tablet Take 1 tablet by mouth daily.      . nitroGLYCERIN (NITROSTAT) 0.4 MG SL tablet Place 0.4 mg under the tongue every 5 (five) minutes as needed for chest pain.      . pravastatin (PRAVACHOL) 40 MG tablet Take 40 mg by mouth daily.      . temazepam (RESTORIL) 15 MG capsule Take 15 mg by mouth at bedtime as needed for sleep.      . valACYclovir (VALTREX) 500 MG tablet Take 500 mg by mouth 2 (two) times daily.       No current facility-administered medications for this visit.    Review of Systems Review of Systems  Constitutional: Negative for fever, chills and unexpected weight change.  HENT: Negative for congestion, hearing loss, sore throat, trouble swallowing and voice change.   Eyes: Negative for visual disturbance.  Respiratory: Negative for cough and wheezing.   Cardiovascular: Negative for chest pain, palpitations and leg swelling.  Gastrointestinal: Negative for nausea, vomiting, abdominal pain, diarrhea, constipation, blood in stool, abdominal distention and anal bleeding.  Genitourinary: Negative for hematuria, vaginal bleeding and difficulty urinating.  Musculoskeletal: Negative for arthralgias.  Skin: Negative for rash and wound.  Neurological: Negative for seizures, syncope and headaches.  Hematological: Negative for adenopathy. Does not bruise/bleed easily.  Psychiatric/Behavioral: Negative for confusion.    Blood pressure 126/78, pulse 73, temperature 97.5 F (36.4 C), height _0  (1.575 m), weight 122 lb (55.339 kg).  Physical Exam Physical Exam  Constitutional: She is oriented to person, place, and time. She appears well-developed and well-nourished.  HENT:  Head: Normocephalic and atraumatic.  Eyes: Pupils are equal, round, and reactive to light. No scleral icterus.  Neck: Normal range of motion.  Cardiovascular: Normal rate and  regular rhythm.   Pulmonary/Chest: Right breast exhibits no inverted nipple, no mass, no nipple discharge, no skin change and no tenderness. Left breast exhibits no inverted nipple, no mass, no nipple discharge, no skin change and no tenderness. Breasts are symmetrical.    Musculoskeletal: Normal range of motion.  Lymphadenopathy:    She has no cervical adenopathy.  Neurological: She is alert and oriented to person, place, and time.  Skin: Skin is warm and dry.  Psychiatric: She has a normal mood and affect. Her behavior is normal. Judgment and thought content normal.    Data Reviewed CLINICAL DATA: Recently diagnosed left breast invasive mammary  carcinoma. Preoperative evaluation.  LABS: BUN and creatinine were obtained on site at [facility] at  [address]  Results: BUN 11 mg/dL, Creatinine 0.8 mg/dL.  EXAM:  BILATERAL BREAST MRI WITH AND WITHOUT CONTRAST  TECHNIQUE:  Multiplanar, multisequence MR images of both breasts were obtained  prior to and following the intravenous administration of 47m of  MultiHance  THREE-DIMENSIONAL MR IMAGE RENDERING ON INDEPENDENT WORKSTATION:  Three-dimensional MR images were rendered by post-processing of the  original MR data on an independent workstation. The  three-dimensional MR images were interpreted, and findings are  reported in the following complete MRI report for this study. Three  dimensional images were evaluated at the independent DynaCad  workstation  [facility]: [facility]2014[facility]/03/2013. Also,  comparison previous left breast ultrasound dated 01/14/2014.  FINDINGS:  Breast composition: c: Heterogeneous fibroglandular tissue  Background parenchymal enhancement: Mild  Right breast: No mass or abnormal enhancement.  Left breast: There is a signal void located within the superior left  breast related to the patient's recent left  breast ultrasound guided  biopsy. There is no abnormal enhancement seen in this region in the  area of the patient's known malignancy. There is a smaller signal  void located within the inferior left breast related to a previous  benign ultrasound-guided core biopsy. There is no abnormal  enhancement within the left breast.  Lymph nodes: No abnormal appearing lymph nodes.  Ancillary findings: None.  IMPRESSION:  No abnormal enhancement associated the known malignancy within the  superior left breast. There is no evidence for adenopathy and there  are no additional findings.  RECOMMENDATION:  Treatment plan.  BI-RADS CATEGORY 6: Known biopsy-proven malignancy.  Electronically Signed  By: RLuberta RobertsonM.D.  On: 02/19/2014 15:18 Mammogram ultrasound reviewed. 7 mm mass left upper-outer quadrant core biopsy proven to be invasive ductal carcinoma. HER-2/neu negative ER and PR receptors pending  Assessment    Stage I left breast cancer  Very dense breast tissue on exam    Plan    Pt has noother abnormalities on MR. Recommend breast conservation with left breast  seed localized lumpectomy and SLN mapping. The procedure has been discussed with the patient. Alternatives to surgery have been discussed with the patient.  Risks of surgery include bleeding,  Infection,  Seroma formation, death,  and the need for further surgery.   The patient understands and wishes to proceed.Sentinel lymph node mapping and dissection has been discussed with the patient.  Risk of bleeding,  Infection,  Seroma formation,  Additional procedures,,  Shoulder weakness ,  Shoulder stiffness,  Nerve and blood vessel injury and reaction to the mapping dyes have been discussed.  Alternatives to surgery have been discussed with the patient.  The patient agrees to proceed.       Toivo Bordon A. 02/22/2014, 12:31 PM

## 2014-03-21 NOTE — Discharge Instructions (Signed)
[place] Office Phone Number [phone]  BREAST BIOPSY/ PARTIAL MASTECTOMY: POST OP INSTRUCTIONS  Always review your discharge instruction sheet given to you by the facility where your surgery was performed.  IF YOU HAVE DISABILITY OR FAMILY LEAVE FORMS, YOU MUST BRING THEM TO THE OFFICE FOR PROCESSING.  DO NOT GIVE THEM TO YOUR DOCTOR.  1. A prescription for pain medication may be given to you upon discharge.  Take your pain medication as prescribed, if needed.  If narcotic pain medicine is not needed, then you may take acetaminophen (Tylenol) or ibuprofen (Advil) as needed. 2. Take your usually prescribed medications unless otherwise directed 3. If you need a refill on your pain medication, please contact your pharmacy.  They will contact our office to request authorization.  Prescriptions will not be filled after 5pm or on week-ends. 4. You should eat very light the first 24 hours after surgery, such as soup, crackers, pudding, etc.  Resume your normal diet the day after surgery. 5. Most patients will experience some swelling and bruising in the breast.  Ice packs and a good support bra will help.  Swelling and bruising can take several days to resolve.  6. It is common to experience some constipation if taking pain medication after surgery.  Increasing fluid intake and taking a stool softener will usually help or prevent this problem from occurring.  A mild laxative (Milk of Magnesia or Miralax) should be taken according to package directions if there are no bowel movements after 48 hours. 7. Unless discharge instructions indicate otherwise, you may remove your bandages 24-48 hours after surgery, and you may shower at that time.  You may have steri-strips (small skin tapes) in place directly over the incision.  These strips should be left on the skin for 7-10 days.  If your surgeon used skin glue on the incision, you may shower in 24 hours.  The glue will flake off over the  next 2-3 weeks.  Any sutures or staples will be removed at the office during your follow-up visit. 8. ACTIVITIES:  You may resume regular daily activities (gradually increasing) beginning the next day.  Wearing a good support bra or sports bra minimizes pain and swelling.  You may have sexual intercourse when it is comfortable. a. You may drive when you no longer are taking prescription pain medication, you can comfortably wear a seatbelt, and you can safely maneuver your car and apply brakes. b. RETURN TO WORK:  ______________________________________________________________________________________ 9. You should see your doctor in the office for a follow-up appointment approximately two weeks after your surgery.  Your doctors nurse will typically make your follow-up appointment when she calls you with your pathology report.  Expect your pathology report 2-3 business days after your surgery.  You may call to check if you do not hear from [place] after three days. 10. OTHER INSTRUCTIONS: _______________________________________________________________________________________________ _____________________________________________________________________________________________________________________________________ _____________________________________________________________________________________________________________________________________ _____________________________________________________________________________________________________________________________________  WHEN TO CALL YOUR DOCTOR: 1. Fever over 101.0 2. Nausea and/or vomiting. 3. Extreme swelling or bruising. 4. Continued bleeding from incision. 5. Increased pain, redness, or drainage from the incision.  The clinic staff is available to answer your questions during regular business hours.  Please dont hesitate to call and ask to speak to one of the nurses for clinical concerns.  If you have a medical emergency, go to the nearest  emergency room or call 911.  A surgeon from [facility] is always on call at the hospital.  For further questions, please visit [email or web address]  Expect some blue discoloration to the breast and urine.  This will resolve.  Ice packs to incisions and wear binder.  Shower Friday.    Post Anesthesia Home Care Instructions  Activity: Get plenty of rest for the remainder of the day. A responsible adult should stay with you for 24 hours following the procedure.  For the next 24 hours, DO NOT: -Drive a car -Paediatric nurse -Drink alcoholic beverages -Take any medication unless instructed by your physician -Make any legal decisions or sign important papers.  Meals: Start with liquid foods such as gelatin or soup. Progress to regular foods as tolerated. Avoid greasy, spicy, heavy foods. If nausea and/or vomiting occur, drink only clear liquids until the nausea and/or vomiting subsides. Call your physician if vomiting continues.  Special Instructions/Symptoms: Your throat may feel dry or sore from the anesthesia or the breathing tube placed in your throat during surgery. If this causes discomfort, gargle with warm salt water. The discomfort should disappear within 24 hours.

## 2014-03-21 NOTE — Progress Notes (Signed)
Assisted Dr. Marcie Bal with left, ultrasound guided, pectoralis block. Side rails up, monitors on throughout procedure. See vital signs in flow sheet. Tolerated Procedure well.

## 2014-03-21 NOTE — Anesthesia Preprocedure Evaluation (Signed)
Anesthesia Evaluation  Patient identified by MRN, date of birth, ID band Patient awake    Reviewed: Allergy & Precautions, H&P , NPO status , Patient's Chart, lab work & pertinent test results  History of Anesthesia Complications (+) PONV  Airway Mallampati: II  Neck ROM: full    Dental   Pulmonary          Cardiovascular hypertension,     Neuro/Psych  Headaches, Anxiety    GI/Hepatic GERD-  ,  Endo/Other  Hypothyroidism   Renal/GU      Musculoskeletal  (+) Arthritis -,   Abdominal   Peds  Hematology   Anesthesia Other Findings   Reproductive/Obstetrics                           Anesthesia Physical Anesthesia Plan  ASA: II  Anesthesia Plan: General and Regional   Post-op Pain Management: MAC Combined w/ Regional for Post-op pain   Induction: Intravenous  Airway Management Planned: LMA  Additional Equipment:   Intra-op Plan:   Post-operative Plan:   Informed Consent: I have reviewed the patients History and Physical, chart, labs and discussed the procedure including the risks, benefits and alternatives for the proposed anesthesia with the patient or authorized representative who has indicated his/her understanding and acceptance.     Plan Discussed with: CRNA, Anesthesiologist and Surgeon  Anesthesia Plan Comments:         Anesthesia Quick Evaluation

## 2014-03-21 NOTE — Anesthesia Postprocedure Evaluation (Signed)
Anesthesia Post Note  Patient: Zoe Boone  Procedure(s) Performed: Procedure(s) (LRB): LEFT BREAST SEED LOCALIZATION LUMPECTOMY LEFT SENTINEL LYMPH NODE MAPPING (Left)  Anesthesia type: General  Patient location: PACU  Post pain: Pain level controlled and Adequate analgesia  Post assessment: Post-op Vital signs reviewed, Patient's Cardiovascular Status Stable, Respiratory Function Stable, Patent Airway and Pain level controlled  Last Vitals:  Filed Vitals:   03/21/14 1330  BP: 190/98  Pulse: 60  Temp:   Resp: 14    Post vital signs: Reviewed and stable  Level of consciousness: awake, alert  and oriented  Complications: No apparent anesthesia complications

## 2014-04-04 ENCOUNTER — Ambulatory Visit
Admission: RE | Admit: 2014-04-04 | Discharge: 2014-04-04 | Disposition: A | Discharge status: Home / Self Care | Payer: Medicare Other | Source: Ambulatory Visit | Attending: Radiation Oncology | Admitting: Radiation Oncology

## 2014-04-04 VITALS — BP 153/86 | HR 63 | Temp 98.4°F | Wt 122.0 lb

## 2014-04-04 DIAGNOSIS — Z170 Estrogen receptor positive status [ER+]: Secondary | ICD-10-CM | POA: Diagnosis not present

## 2014-04-04 DIAGNOSIS — C50412 Malignant neoplasm of upper-outer quadrant of left female breast: Secondary | ICD-10-CM

## 2014-04-04 DIAGNOSIS — Z510 Encounter for antineoplastic radiation therapy: Secondary | ICD-10-CM | POA: Diagnosis present

## 2014-04-04 NOTE — Progress Notes (Signed)
   Department of Radiation Oncology  Phone:  [phone] Fax:        [phone]   Name: Zoe Boone MRN: [id number]  DOB: 1941/04/03  Date: 04/04/2014  Follow Up Visit Note  Diagnosis:    ICD-9-CM ICD-10-CM  1. Malignant neoplasm of upper-outer quadrant of female breast, left 174.4 C50.412  2. Breast cancer of upper-outer quadrant of left female breast 174.4 C50.412   Interval History: Zoe Boone presents today for routine followup.  She underwent her lumpectomy on 9/17. She was found to have a 0.9 cm IDC with negative margins (An inferior margin was focally close but no tumor on ink). One lymph node was taken which was benign. The tumor was ER and PR positive as well as HER 2 negativ.e She is accompanied by her daughter. She developed a wound infection and is on the last 2 days of antibiotic for that. She would like to be treated in [place].  She has an appointment with Dr. Brantley Stage next week.   Physical Exam:  Filed Vitals:   04/04/14 1105  BP: 153/86  Pulse: 63  Temp: 98.4 F (36.9 C)  Weight: 122 lb (55.339 kg)   Healing left breast incision. No signs of infection.   IMPRESSION: Zoe Boone is a 73 y.o. female s/p lumpectomy   PLAN:  I spoke to the patient today regarding her diagnosis and options for treatment. We discussed the role of radiation in decreasing local failures in patients who undergo lumpectomy. We discussed the process of simulation and the placement tattoos. We discussed 16 treatments as an outpatient. We discussed the possibility of asymptomatic lung and heart damage. We discussed the low likelihood of secondary malignancies. We discussed the possible side effects including but not limited to skin redness, fatigue, permanent skin darkening, and breast swelling. We discussed the randomized trials looking at AI plus radiation vs. AI alone in patients over 70 treated after lumpectomy.  She would like to undergo radiation in [place]. We will make an appointment for her  in mid October. If Dr. Brantley Stage wanted more time to allow her to heal, we can always delay.  She has an appointment with Dr. Jana Hakim to discuss antiestrogen treatment as well.   Thea Silversmith, MD

## 2014-04-04 NOTE — Addendum Note (Signed)
Encounter addended by: Thea Silversmith, MD on: 04/04/2014  8:57 PM<BR>     Documentation filed: Orders

## 2014-04-04 NOTE — Progress Notes (Signed)
Location of Breast Cancer:Left Breast 7 mm upper outer quadrant.   Histology per Pathology Report: 03/21/14 Diagnosis 1. Breast, lumpectomy, Left with radioactive - see description - INVASIVE DUCTAL CARCINOMA, 0.9 CM. - DUCTAL CARCINOMA IN SITU. - MARGINS NOT INVOLVED. - TUMOR FOCALLY LESS THAN 0.1 CM FROM INFERIOR MARGIN. 2. Lymph node, sentinel, biopsy, Left axillary #1 - ONE BENIGN LYMPH NODE (0/1).  01/18/14 Left needle core biopsy mass 12 o'clock 4 cm invasive mammary cancer  Receptor Status: ER(), PR (), Her2-neu (-)  Did patient present with symptoms (if so, please note symptoms) or was this found on screening mammography?:Found on screening mammogram.  02/19/14:bilateral breast YYP:[id number]:  No abnormal enhancement associated the known malignancy within the  superior left breast. There is no evidence for adenopathy and there  are no additional findings.  Past/Anticipated interventions by surgeon, if any:03/21/14 Panel 1: Left LEFT BREAST SEED LOCALIZATION LUMPECTOMY LEFT SENTINEL LYMPH NODE MAPPING with Erroll Luna, MD  Past/Anticipated interventions by medical oncology, if any: Chemotherapy: seen by Dr.Magrinat 02/21/14.chemotherapy not reccommended.ssuggests lumpectomy, radiation and 5 years of anti-estrogen.  Lymphedema issues, if any: No  Pain issues, if any: No  SAFETY ISSUES:  Prior radiation? No  Pacemaker/ICD?No  Possible current pregnancy?No hysterectomy.last period age 58.  Is the patient on methotrexate?No Current Complaints / other details:Married. Menses age 62. 2 children first age 61.Last menstrual cycle age 13. Hormonal replacement therapy 6 to 8 years.   Allergies:aspirin  Patient back as follow up new consult post left breast surgery on 03/21/14.Patient had incision and drainage of left on 04/02/14.Currently on 10 day antibiotic therapy of levofloxacin.Given 2 injections by Dr.Grisson (PCP) on 03/29/14 for throat/chest secretions.

## 2014-04-09 ENCOUNTER — Telehealth: Payer: Self-pay | Admitting: *Deleted

## 2014-04-09 NOTE — Telephone Encounter (Signed)
CALLED PATIENT TO INFORM OF APPT. WITH DR. PALERMO ON 04-11-14 @ 1:30 PM, LVM FOR A RETURN CALL

## 2014-04-17 ENCOUNTER — Telehealth: Payer: Self-pay | Admitting: *Deleted

## 2014-04-17 ENCOUNTER — Other Ambulatory Visit: Payer: Self-pay | Admitting: Emergency Medicine

## 2014-04-17 NOTE — Telephone Encounter (Signed)
Pt called stating that she has a radiation appt on Friday to discuss her treatments.  Wishes to cancel Dr. Virgie Dad appt for 10/16 until she is finished with radiation.  She will call back to reschedule after she has the treatment info from [facility].  Cancelled appt as requested.

## 2014-04-19 ENCOUNTER — Ambulatory Visit: Payer: Medicare Other | Admitting: Oncology

## 2014-04-23 ENCOUNTER — Telehealth: Payer: Self-pay | Admitting: *Deleted

## 2014-04-23 NOTE — Telephone Encounter (Signed)
Received a message that pt may want to cancel xrt and take AI.  Called pt to assess needs. Pt relate she had xrt with Dr. Silvano Rusk and she would like to continue. She was having extreme anxiety prior to her 1st treatment.  Validated her feelings. Encourage pt to call with further needs or to discuss feelings of uncertainty r/t treatment. Received verbal understanding. Contact information given.

## 2014-05-06 ENCOUNTER — Encounter (HOSPITAL_BASED_OUTPATIENT_CLINIC_OR_DEPARTMENT_OTHER): Payer: Self-pay | Admitting: Certified Registered"

## 2014-05-16 ENCOUNTER — Telehealth: Payer: Self-pay | Admitting: *Deleted

## 2014-05-16 NOTE — Telephone Encounter (Signed)
Confirmed f/u appt with Dr. Jana Hakim on 05/22/14. Pt denies further needs at this time. Pt is currently receiving xrt with Dr. Orlene Erm.

## 2014-05-21 ENCOUNTER — Other Ambulatory Visit: Payer: Self-pay | Admitting: *Deleted

## 2014-05-21 DIAGNOSIS — C50412 Malignant neoplasm of upper-outer quadrant of left female breast: Secondary | ICD-10-CM

## 2014-05-22 ENCOUNTER — Telehealth: Payer: Self-pay | Admitting: Oncology

## 2014-05-22 ENCOUNTER — Other Ambulatory Visit (HOSPITAL_BASED_OUTPATIENT_CLINIC_OR_DEPARTMENT_OTHER): Payer: Medicare Other

## 2014-05-22 ENCOUNTER — Ambulatory Visit (HOSPITAL_BASED_OUTPATIENT_CLINIC_OR_DEPARTMENT_OTHER): Payer: Medicare Other | Admitting: Oncology

## 2014-05-22 VITALS — BP 154/75 | HR 70 | Temp 98.7°F | Resp 18 | Ht 62.0 in | Wt 125.0 lb

## 2014-05-22 DIAGNOSIS — C50412 Malignant neoplasm of upper-outer quadrant of left female breast: Secondary | ICD-10-CM

## 2014-05-22 DIAGNOSIS — E079 Disorder of thyroid, unspecified: Secondary | ICD-10-CM

## 2014-05-22 LAB — CBC WITH DIFFERENTIAL/PLATELET
BASO%: 0.4 % (ref 0.0–2.0)
Basophils Absolute: 0 10*3/uL (ref 0.0–0.1)
EOS%: 2.9 % (ref 0.0–7.0)
Eosinophils Absolute: 0.1 10*3/uL (ref 0.0–0.5)
HCT: 36.8 % (ref 34.8–46.6)
HGB: 11.9 g/dL (ref 11.6–15.9)
LYMPH#: 1 10*3/uL (ref 0.9–3.3)
LYMPH%: 21.8 % (ref 14.0–49.7)
MCH: 31.6 pg (ref 25.1–34.0)
MCHC: 32.3 g/dL (ref 31.5–36.0)
MCV: 97.6 fL (ref 79.5–101.0)
MONO#: 0.4 10*3/uL (ref 0.1–0.9)
MONO%: 7.7 % (ref 0.0–14.0)
NEUT%: 67.2 % (ref 38.4–76.8)
NEUTROS ABS: 3.1 10*3/uL (ref 1.5–6.5)
Platelets: 232 10*3/uL (ref 145–400)
RBC: 3.77 10*6/uL (ref 3.70–5.45)
RDW: 13.1 % (ref 11.2–14.5)
WBC: 4.5 10*3/uL (ref 3.9–10.3)

## 2014-05-22 LAB — COMPREHENSIVE METABOLIC PANEL (CC13)
ALBUMIN: 4.1 g/dL (ref 3.5–5.0)
ALT: 15 U/L (ref 0–55)
ANION GAP: 9 meq/L (ref 3–11)
AST: 22 U/L (ref 5–34)
Alkaline Phosphatase: 70 U/L (ref 40–150)
BILIRUBIN TOTAL: 0.24 mg/dL (ref 0.20–1.20)
BUN: 22.8 mg/dL (ref 7.0–26.0)
CALCIUM: 10.1 mg/dL (ref 8.4–10.4)
CHLORIDE: 103 meq/L (ref 98–109)
CO2: 28 meq/L (ref 22–29)
Creatinine: 0.8 mg/dL (ref 0.6–1.1)
Glucose: 115 mg/dl (ref 70–140)
Potassium: 4.3 mEq/L (ref 3.5–5.1)
SODIUM: 139 meq/L (ref 136–145)
TOTAL PROTEIN: 6.8 g/dL (ref 6.4–8.3)

## 2014-05-22 MED ORDER — TAMOXIFEN CITRATE 20 MG PO TABS: 20.0000 mg | ORAL_TABLET | Freq: Every day | ORAL | Status: AC

## 2014-05-22 NOTE — Progress Notes (Signed)
Zoe  [phone]) [phone] [phone]) [phone]     ID: Zoe Boone DOB: [birth year]  MR#: [id number]  GGE#:[id number]  Patient Care Team: Raina Mina, MD as PCP - General (Internal Medicine) Chauncey Cruel, MD as Consulting Physician (Oncology) Erroll Luna, MD as Consulting Physician (General Surgery) Thea Silversmith, MD as Consulting Physician (Radiation Oncology) Richardo Priest, MD as Referring Physician (Cardiology)  OTHER MD: Gatha Mayer M.D.  CHIEF COMPLAINT: estrogen receptor positive breast cancer  CURRENT TREATMENT: tamaoaxifen  BREAST CANCER HISTORY: From the original intake note:  "Zoe Boone" had routine screening mammography in [facility] showing a possible mass in the upper left breast. Additional views and left breast ultrasonography confirmed a 7 mm mass in the upper-outer quadrant. This was biopsied under ultrasound guidance in [facility] 01/17/2014, and the pathology showed ([id number]) an invasive ductal carcinoma, E-cadherin positive, grade 1 or 2, estrogen receptor 99% positive, progesterone receptor 14% positive, with no amplification of HER-2, and with an MIB-1 of 17%.  On 02/19/2014 the patient underwent bilateral breast MRI. This showed a signal void in the superior left breast, but no abnormal enhancement, suggesting that most if not all of the original lesion was removed and biopsied.  The patient's subsequent history is as detailed below  INTERVAL HISTORY: Zoe Boone returns today for follow-up of her breast cancer. Since her last visit here, she underwent left lumpectomy and sentinel lymph node sampling. This showed (03/21/2014-SZA [id number]) and invasive ductal carcinoma measuring 0.9 cm, grade 2, with repeat HER-2 again negative, and close but negative margins. Zoe Boone tolerated the surgery well, without unusual pain, fever, or infection. After her surgery she received adjuvant radiation in [place], under the care of  Dr. Orlene Erm. This was completed 3 days ago.  REVIEW OF SYSTEMS: Zoe Boone had a hard time with the radiation. She felt it was burning her eyelids and mouth. She wore a washcloth over her face during the treatments. It made her skin dry and wrinkle (over her chin) she feels. Her breast skin "broke out all fall". She is still using creams to help with that. She was very fatigued. This has not entirely resolved. Otherwise a detailed review of systems today was noncontributory  PAST MEDICAL HISTORY: Past Medical History  Diagnosis Date  . Anxiety   . Hypertension   . Cancer [phone]    left breast mammary carcinoma  . GERD (gastroesophageal reflux disease)   . Hyperlipidemia   . Thyroid disease   . PONV (postoperative nausea and vomiting)   . Arthritis     PAST SURGICAL HISTORY: Past Surgical History  Procedure Laterality Date  . Cataract extraction      bilat.   . Colonoscopy  2014  . Knee arthroscopy  2010    right   . Bladder suspension  1992  . Abdominal hysterectomy  1985  . Bunionectomy  1990    lt/rt  . Breast surgery      multiple br bx    FAMILY HISTORY Family History  Problem Relation Age of Onset  . Leukemia Father   . Diabetes Father   . Cancer Father     luekemia  . Skin cancer Mother   . Hypertension Mother   . Kidney cancer Brother   . Melanoma Brother    the patient's father died from complications of chronic leukemia at the age of 46. He had been diagnosed at age 71. The patient's mother died from "old age" at age 77. The patient has  3 brothers and 2 sisters. One brother was diagnosed with kidney cancer at the age of 13. There is one maternal aunt (out of 2) with breast cancer diagnosed at age 14, , and one paternal aunt (50) diagnosed with breast cancer at age 47. There is no history of ovarian cancer in the family.  GYNECOLOGIC HISTORY:  No LMP recorded. Patient has had a hysterectomy. Menarche age 40, first live birth age 44. The patient is GX P2. She  underwent abdominal hysterectomy and subsequent bilateral salpingo-oophorectomy at age 15. She took hormone replacement for approximately 10 years.  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 Her husband Zoe Boone is retired from [employer]. It suggests that 2 of them at home, with no pets. Their daughter Zoe Boone is a Education officer, museum in [place], as is your second daughter, Zoe Boone. The patient has 4 grandchildren, including a granddaughter who is just entering medical school in [place], 2 grandsons at [employer], and a fourth is a Print production planner. The patient attends a local [name]     ADVANCED DIRECTIVES: Not in place   HEALTH MAINTENANCE: History  Substance Use Topics  . Smoking status: Never Smoker   . Smokeless tobacco: Not on file  . Alcohol Use: No     Colonoscopy: Up to date/Zoe Boone  PAP:  Bone density:  Lipid panel:  Allergies  Allergen Reactions  . Aspirin Nausea Only    Gi upset    Current Outpatient Prescriptions  Medication Sig Dispense Refill  . ALPRAZolam (XANAX) 0.25 MG tablet Take 0.25 mg by mouth at bedtime as needed for anxiety.    . benazepril (LOTENSIN) 20 MG tablet Take 20 mg by mouth daily.    . Cholecalciferol 1000 UNITS capsule Take 1,000 Units by mouth daily.    Marland Kitchen HYDROcodone-acetaminophen (NORCO) 5-325 MG per tablet Take 1 tablet by mouth every 6 (six) hours as needed for moderate pain. 30 tablet 0  . levofloxacin (LEVAQUIN) 500 MG tablet     . levothyroxine (SYNTHROID, LEVOTHROID) 75 MCG tablet Take 75 mcg by mouth daily before breakfast.    . metoprolol succinate (TOPROL-XL) 25 MG 24 hr tablet Take 25 mg by mouth daily.    . Multiple Vitamin (MULTIVITAMIN) tablet Take 1 tablet by mouth daily.    . nitroGLYCERIN (NITROSTAT) 0.4 MG SL tablet Place 0.4 mg under the tongue every 5 (five) minutes as needed for chest pain.    Marland Kitchen omeprazole  (PRILOSEC) 20 MG capsule Take 20 mg by mouth daily.    . pravastatin (PRAVACHOL) 40 MG tablet Take 40 mg by mouth daily.    . temazepam (RESTORIL) 15 MG capsule Take 15 mg by mouth at bedtime as needed for sleep.    . valACYclovir (VALTREX) 500 MG tablet Take 500 mg by mouth 2 (two) times daily.     No current facility-administered medications for this visit.    OBJECTIVE: Middle-aged white woman in no acute distress Filed Vitals:   05/22/14 1356  BP: 154/75  Pulse: 70  Temp: 98.7 F (37.1 C)  Resp: 18     Body mass index is 22.86 kg/(m^2).    ECOG FS:1 - Symptomatic but completely ambulatory  Sclerae unicteric, pupils small, round and equal Oropharynx clear and moist No cervical or supraclavicular adenopathy Lungs no rales or rhonchi Heart regular rate and rhythm Abd soft, nontender, positive bowel sounds MSK no focal  spinal tenderness, no upper extremity lymphedema Neuro: nonfocal, well oriented, anxious affect Breasts: the right breast is status post remote biopsy for benign disease. The left breast is status post recent lumpectomy and radiation. There is a palpable scattered rash in the superior aspect of the breast. Beneath the nipple although there is a nonpalpable radiation area which may eventually resolve in 2 telangiectasias. There is no evidence of recurrence or residual disease. The left axilla is benign.    LAB RESULTS:  CMP     Component Value Date/Time   NA 139 05/22/2014 1337   NA 140 03/19/2014 1100   K 4.3 05/22/2014 1337   K 4.5 03/19/2014 1100   CL 100 03/19/2014 1100   CO2 28 05/22/2014 1337   CO2 29 03/19/2014 1100   GLUCOSE 115 05/22/2014 1337   GLUCOSE 89 03/19/2014 1100   BUN 22.8 05/22/2014 1337   BUN 17 03/19/2014 1100   CREATININE 0.8 05/22/2014 1337   CREATININE 0.74 03/19/2014 1100   CALCIUM 10.1 05/22/2014 1337   CALCIUM 10.1 03/19/2014 1100   PROT 6.8 05/22/2014 1337   PROT 7.5 03/19/2014 1100   ALBUMIN 4.1 05/22/2014 1337   ALBUMIN  4.4 03/19/2014 1100   AST 22 05/22/2014 1337   AST 25 03/19/2014 1100   ALT 15 05/22/2014 1337   ALT 17 03/19/2014 1100   ALKPHOS 70 05/22/2014 1337   ALKPHOS 75 03/19/2014 1100   BILITOT 0.24 05/22/2014 1337   BILITOT 0.2* 03/19/2014 1100   GFRNONAA 84* 03/19/2014 1100   GFRAA >90 03/19/2014 1100    I No results found for: SPEP  Lab Results  Component Value Date   WBC 4.5 05/22/2014   NEUTROABS 3.1 05/22/2014   HGB 11.9 05/22/2014   HCT 36.8 05/22/2014   MCV 97.6 05/22/2014   PLT 232 05/22/2014      Chemistry      Component Value Date/Time   NA 139 05/22/2014 1337   NA 140 03/19/2014 1100   K 4.3 05/22/2014 1337   K 4.5 03/19/2014 1100   CL 100 03/19/2014 1100   CO2 28 05/22/2014 1337   CO2 29 03/19/2014 1100   BUN 22.8 05/22/2014 1337   BUN 17 03/19/2014 1100   CREATININE 0.8 05/22/2014 1337   CREATININE 0.74 03/19/2014 1100      Component Value Date/Time   CALCIUM 10.1 05/22/2014 1337   CALCIUM 10.1 03/19/2014 1100   ALKPHOS 70 05/22/2014 1337   ALKPHOS 75 03/19/2014 1100   AST 22 05/22/2014 1337   AST 25 03/19/2014 1100   ALT 15 05/22/2014 1337   ALT 17 03/19/2014 1100   BILITOT 0.24 05/22/2014 1337   BILITOT 0.2* 03/19/2014 1100       No results found for: LABCA2  No components found for: LABCA125  No results for input(s): INR in the last 168 hours.  Urinalysis No results found for: COLORURINE  STUDIES: No results found.  ASSESSMENT: 73 y.o. [place] woman s/p left upper-outer quadrant biopsy 01/16/2014 for a clinical T1b N0, stage IA invasive ductal breast cancer  (1) status post left lumpectomy and sentinel lymph node sampling 03/21/2014 for a pT1b pN0, stage IA invasive ductal carcinoma, repeat HER-2 again negative.  (2) adjuvant radiation completed 06/17/2014 in [place]  (3) to start tamoxifen 07/05/2014  (a) status post remote TAH-BSO  PLAN: Zoe Boone has completed her local treatment and is now ready for systemic therapy. Her  prognosis is good even without that, but it will become better if she can  tolerate anti-estrogens for 5 years. Because she is status post hysterectomy, I think tamoxifen may be a particularly good choice for her. Today we did discuss the possible toxicities, side effects and complications of this agent. If she can tolerate it, I would continue for 5 years, since the benefit of an additional 5 years is likely to be minimal or nonexistent in this case.  However I think she is still recovering from the diagnosis itself as well as from the local treatment. It might be best to wait until after the holidays and she is very much in agreement with this plan.  Accordingly she will start tamoxifen January 1. She will return to see me some time in March to make sure that she is tolerating it well. If she has we will see her on an every 6 month schedule for the first 2 years and yearly for the remaining 3 years. She may be also a good candidate for our survivorship clinic.    Chauncey Cruel, MD   05/22/2014 2:36 PM

## 2014-05-23 NOTE — Addendum Note (Signed)
Addended by: Laureen Abrahams on: 05/23/2014 05:08 PM   Modules accepted: Orders, Medications

## 2014-05-24 ENCOUNTER — Other Ambulatory Visit: Payer: Self-pay | Admitting: *Deleted

## 2014-05-27 ENCOUNTER — Other Ambulatory Visit: Payer: Self-pay | Admitting: *Deleted

## 2014-06-07 ENCOUNTER — Telehealth (INDEPENDENT_AMBULATORY_CARE_PROVIDER_SITE_OTHER): Payer: Self-pay

## 2014-06-07 NOTE — Telephone Encounter (Signed)
-----   Message from Tawanna Cooler sent at 06/07/2014 12:45 PM EST ----- Regarding: Needs a return call Contact: [facility],  Pt left a message on my voicemail stating that one of the scabs have fallen off and now it is oozing yellow.  She would like a return call to discuss this with nurse.  Thanks, Lattie Haw

## 2014-06-07 NOTE — Telephone Encounter (Signed)
Called pt back and left msg with family to return my call

## 2014-06-13 ENCOUNTER — Ambulatory Visit (INDEPENDENT_AMBULATORY_CARE_PROVIDER_SITE_OTHER): Payer: Medicare Other

## 2014-06-13 VITALS — BP 154/90 | HR 64 | Resp 12

## 2014-06-13 DIAGNOSIS — R52 Pain, unspecified: Secondary | ICD-10-CM

## 2014-06-13 DIAGNOSIS — Q828 Other specified congenital malformations of skin: Secondary | ICD-10-CM

## 2014-06-13 DIAGNOSIS — M7751 Other enthesopathy of right foot: Secondary | ICD-10-CM

## 2014-06-13 DIAGNOSIS — M898X9 Other specified disorders of bone, unspecified site: Secondary | ICD-10-CM

## 2014-06-13 DIAGNOSIS — M779 Enthesopathy, unspecified: Secondary | ICD-10-CM

## 2014-06-13 DIAGNOSIS — M2041 Other hammer toe(s) (acquired), right foot: Secondary | ICD-10-CM

## 2014-06-13 NOTE — Patient Instructions (Signed)
Corns and Calluses Corns are small areas of thickened skin that usually occur on the top, sides, or tip of a toe. They contain a cone-shaped core with a point that can press on a nerve below. This causes pain. Calluses are areas of thickened skin that usually develop on hands, fingers, palms, soles of the feet, and heels. These are areas that experience frequent friction or pressure. CAUSES  Corns are usually the result of rubbing (friction) or pressure from shoes that are too tight or do not fit properly. Calluses are caused by repeated friction and pressure on the affected areas. SYMPTOMS  A hard growth on the skin.  Pain or tenderness under the skin.  Sometimes, redness and swelling.  Increased discomfort while wearing tight-fitting shoes. DIAGNOSIS  Your caregiver can usually tell what the problem is by doing a physical exam. TREATMENT  Removing the cause of the friction or pressure is usually the only treatment needed. However, sometimes medicines can be used to help soften the hardened, thickened areas. These medicines include salicylic acid plasters and 12% ammonium lactate lotion. These medicines should only be used under the direction of your caregiver. HOME CARE INSTRUCTIONS   Try to remove pressure from the affected area.  You may wear donut-shaped corn pads to protect your skin.  You may use a pumice stone or nonmetallic nail file to gently reduce the thickness of a corn.  Wear properly fitted footwear.  If you have calluses on the hands, wear gloves during activities that cause friction.  If you have diabetes, you should regularly examine your feet. Tell your caregiver if you notice any problems with your feet. SEEK IMMEDIATE MEDICAL CARE IF:   You have increased pain, swelling, redness, or warmth in the affected area.  Your corn or callus starts to drain fluid or bleeds.  You are not getting better, even with treatment. Document Released: 03/27/2004 Document  Revised: 09/13/2011 Document Reviewed: 02/16/2011 [facility] Patient Information 2015 [place], [place]. This information is not intended to replace advice given to you by your health care provider. Make sure you discuss any questions you have with your health care provider.   A painful corn of their fifth toe is occurring because of regrowth or bone spur on the fifth toe. Continues an option for surgery to remove the bone spur may be necessary.

## 2014-06-13 NOTE — Progress Notes (Signed)
   Subjective:    Patient ID: Zoe Boone, female    DOB: 09-Jun-1941, 73 y.o.   MRN: [id number]  HPI  PT STATED RT FOOT 5TH TOE HAVE CORN AND ITS BEEN HURTING FOR 2 YEARS. THE TOE IS GETTING WORSE ESPECIALLY WHEN PUTTING PRESSURE ON IT. TRIED TO KEEP TRIM.  Review of Systems  Neurological: Positive for headaches.  All other systems reviewed and are negative.      Objective:   Physical Exam neurovascular status is intact he'll pulses palpable DP +2 PT plus one over 4 bilateral capillary fill time 3 seconds epicritic and proprioceptive sensations intact and symmetric bilateral there is normal plantar response DTRs not listed dermatologic the skin color pigment normal hair growth absent nails unremarkable there is keratoses HD 5 right foot. Patient is a history of previous surgery however x-rays confirm regrowth of the proximal phalanx head or osteophyte or bone spur the proximal phalanx head. Sesamoid with a keratoses which is painful tender and symptomatic. No open wounds no ulcers no secondary infections. 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 Plan at this time the keratotic lesions debrided to foam padding is dispensed maintain accommodative shoes contact us future and as-needed basis for either further palliative care or the possibilities of a surgical intervention with redo hammertoe or exostectomy are Flagyl head resection. Up as needed next  Harriet Masson DPM

## 2014-06-17 ENCOUNTER — Other Ambulatory Visit: Payer: Medicare Other

## 2014-06-17 ENCOUNTER — Ambulatory Visit: Payer: Medicare Other | Admitting: Oncology

## 2014-07-09 ENCOUNTER — Telehealth: Payer: Self-pay | Admitting: Oncology

## 2014-07-09 NOTE — Telephone Encounter (Signed)
Faxed pt medical records to [facility]

## 2014-08-20 ENCOUNTER — Telehealth: Payer: Self-pay | Admitting: Nurse Practitioner

## 2014-08-20 ENCOUNTER — Telehealth: Payer: Self-pay | Admitting: *Deleted

## 2014-08-20 ENCOUNTER — Ambulatory Visit (HOSPITAL_BASED_OUTPATIENT_CLINIC_OR_DEPARTMENT_OTHER): Payer: Medicare Other | Admitting: Nurse Practitioner

## 2014-08-20 VITALS — BP 155/82 | HR 66 | Temp 98.2°F | Resp 18 | Wt 127.7 lb

## 2014-08-20 DIAGNOSIS — C50412 Malignant neoplasm of upper-outer quadrant of left female breast: Secondary | ICD-10-CM

## 2014-08-20 DIAGNOSIS — N6452 Nipple discharge: Secondary | ICD-10-CM

## 2014-08-20 DIAGNOSIS — C50912 Malignant neoplasm of unspecified site of left female breast: Secondary | ICD-10-CM

## 2014-08-20 MED ORDER — AMOXICILLIN-POT CLAVULANATE 875-125 MG PO TABS: 1.0000 | ORAL_TABLET | Freq: Two times a day (BID) | ORAL | Status: DC

## 2014-08-20 NOTE — Telephone Encounter (Signed)
THERE IS A BUMPY RED RASH AROUND THE NIPPLE. THE REDNESS IS SPREADING OVER HER BREAST. THERE IS SOME DISCOMFORT AND ITCHING. PT. TO SEE CINDEE BACON,APP.

## 2014-08-20 NOTE — Telephone Encounter (Signed)
Per 02/16 POF, pt added to CB schedule at 1pm..... KJ

## 2014-08-21 ENCOUNTER — Telehealth: Payer: Self-pay | Admitting: Nurse Practitioner

## 2014-08-21 ENCOUNTER — Encounter: Payer: Self-pay | Admitting: Nurse Practitioner

## 2014-08-21 DIAGNOSIS — N6452 Nipple discharge: Secondary | ICD-10-CM | POA: Insufficient documentation

## 2014-08-21 NOTE — Assessment & Plan Note (Addendum)
Patient underwent a left nipple sparing lumpectomy in September 2015 per Dr. Brantley Stage.  Patient has had issues with the lumpectomy surgical site; as well as the sutures around the left nipple for the past several months.  She states within the past 2 weeks she has developed purulent, golden discharge/drainage from the nipple suture line; as well as from the actual nipple itself.  She also feels that her left breast itself has increased in size recently.  She is also complaining of a chronic rash to the left breast; which is now increased.  She has also noted that her entire left nipple area has become very red and tender as well.  She denies any recent fevers or chills.  On exam-patient's left breast lumpectomy site well healed; with no evidence of dehiscence or infection.  The left breast nipple suture line at approximately the 5:00 position does appear to have some drainage that has dried on her breast.  There is no obvious nipple discharge on exam.  Patient does have a scattered pink/red rash to her entire left breast region.  Her left nipple area with increased erythema and mild tenderness on exam. The veins to the left side of the chest are slightly distended as well- but patient states that this is her baseline for the past several months.  Also, her left nipple is now pointing upward; and patient states that this is a new change within the last 2-3 weeks.  Patient will be placed on Augmentin twice daily; and follow-up with Dr. Barry Dienes at [facility] surgery on Thursday, 08/22/2014 for further evaluation and management.  Advised patient to call/return or go directly to the emergency department she develops any worsening symptoms whatsoever.

## 2014-08-21 NOTE — Telephone Encounter (Signed)
Called to check in with patient this morning.  Patient states she did initiate her Augmentin antibiotics last night as directed.  Patient has plans to see Dr. Barry Dienes at [facility] surgery on Thursday, 08/22/2014 at 3 PM.  Advised patient to call/return or go directly to the emergency department in the meantime if she has any new worries or concerns.

## 2014-08-21 NOTE — Assessment & Plan Note (Signed)
Patient is status post left nipple sparing lumpectomy in September 2015 per Dr. Brantley Stage; and radiation therapy completed in November 2015.  Initiated tamoxifen therapy in January 2016.  Patient has plans to return for labs only on 09/11/2014.  Patient will return for follow-up visit with Dr. Jana Hakim on 10/01/2014.

## 2014-08-21 NOTE — Progress Notes (Signed)
SYMPTOM MANAGEMENT CLINIC   HPI: Zoe Boone 73 y.o. female diagnosed with breast cancer.  Patient is status post left nipple sparing lumpectomy and radiation therapy.  Currently undergoing tamoxifen therapy.   Patient called the cancer Center requesting urgent care visit today. Patient underwent a left nipple sparing lumpectomy in September 2015 per Dr. Brantley Stage.  Patient has had issues with the lumpectomy surgical site; as well as the sutures around the left nipple for the past several months.  She states within the past 2 weeks she has developed purulent, golden discharge/drainage from the nipple suture line; as well as from the actual nipple itself.  She also feels that her left breast itself has increased in size recently.  She is also complaining of a chronic rash to the left breast; which is now increased.  She has also noted that her entire left nipple area has become very red and tender as well.  She denies any recent fevers or chills.  HPI  ROS  Past Medical History  Diagnosis Date  . Anxiety   . Hypertension   . Cancer [phone]    left breast mammary carcinoma  . GERD (gastroesophageal reflux disease)   . Hyperlipidemia   . Thyroid disease   . PONV (postoperative nausea and vomiting)   . Arthritis     Past Surgical History  Procedure Laterality Date  . Cataract extraction      bilat.   . Colonoscopy  2014  . Knee arthroscopy  2010    right   . Bladder suspension  1992  . Abdominal hysterectomy  1985  . Bunionectomy  1990    lt/rt  . Breast surgery      multiple br bx    has HYPOTHYROIDISM; HYPERLIPIDEMIA; HYPERTENSION; GERD; INSOMNIA; HEADACHE; Breast cancer of upper-outer quadrant of left female breast; and Breast discharge on her problem list.    is allergic to aspirin.    Medication List       This list is accurate as of: 08/20/14 11:59 PM.  Always use your most recent med list.               ALPRAZolam 0.25 MG tablet  Commonly known as:  XANAX    Take 0.25 mg by mouth at bedtime as needed for anxiety.     amoxicillin-clavulanate 875-125 MG per tablet  Commonly known as:  AUGMENTIN  Take 1 tablet by mouth 2 (two) times daily.     benazepril 20 MG tablet  Commonly known as:  LOTENSIN  Take 20 mg by mouth daily.     Cholecalciferol 1000 UNITS capsule  Take 1,000 Units by mouth daily.     levothyroxine 75 MCG tablet  Commonly known as:  SYNTHROID, LEVOTHROID  Take 75 mcg by mouth daily before breakfast.     metoprolol succinate 25 MG 24 hr tablet  Commonly known as:  TOPROL-XL  Take 25 mg by mouth daily.     multivitamin tablet  Take 1 tablet by mouth daily.     nitroGLYCERIN 0.4 MG SL tablet  Commonly known as:  NITROSTAT  Place 0.4 mg under the tongue every 5 (five) minutes as needed for chest pain.     omeprazole 20 MG capsule  Commonly known as:  PRILOSEC  Take 20 mg by mouth daily.     pravastatin 40 MG tablet  Commonly known as:  PRAVACHOL  Take 40 mg by mouth daily.     temazepam 15 MG capsule  Commonly  known as:  RESTORIL  Take 15 mg by mouth at bedtime as needed for sleep.     valACYclovir 500 MG tablet  Commonly known as:  VALTREX  Take 500 mg by mouth 2 (two) times daily.         PHYSICAL EXAMINATION  Blood pressure 155/82, pulse 66, temperature 98.2 F (36.8 C), temperature source Oral, resp. rate 18, weight 127 lb 11.2 oz (57.924 kg), SpO2 100 %.  Physical Exam  Constitutional: She is oriented to person, place, and time and well-developed, well-nourished, and in no distress.  HENT:  Head: Normocephalic and atraumatic.  Mouth/Throat: Oropharynx is clear and moist.  Eyes: Conjunctivae and EOM are normal. Pupils are equal, round, and reactive to light. Right eye exhibits no discharge. Left eye exhibits no discharge. No scleral icterus.  Neck: Normal range of motion. Neck supple. No JVD present. No tracheal deviation present. No thyromegaly present.  Cardiovascular: Normal rate, regular  rhythm, normal heart sounds and intact distal pulses.   Pulmonary/Chest: Effort normal and breath sounds normal. No respiratory distress. She has no wheezes. She has no rales. She exhibits no tenderness.  Abdominal: Soft. Bowel sounds are normal. She exhibits no distension and no mass. There is no tenderness. There is no rebound and no guarding.  Musculoskeletal: Normal range of motion. She exhibits no edema or tenderness.  Lymphadenopathy:    She has no cervical adenopathy.  Neurological: She is alert and oriented to person, place, and time. Gait normal.  Skin: Skin is warm and dry. Rash noted. There is erythema. There is pallor.   On exam-patient's left breast lumpectomy site well healed; with no evidence of dehiscence or infection.  The left breast nipple suture line at approximately the 5:00 position does appear to have some drainage that has dried on her breast.  There is no obvious nipple discharge on exam.  Patient does have a scattered pink/red rash to her entire left breast region.  Her left nipple area with increased erythema and mild tenderness on exam. The veins to the left side of the chest are slightly distended as well- but patient states that this is her baseline for the past several months.  Also, her left nipple is now pointing upward; and patient states that this is a new change within the last 2-3 weeks.   Psychiatric: Affect normal.  Nursing note and vitals reviewed.   LABORATORY DATA:. No visits with results within 3 Day(s) from this visit. Latest known visit with results is:  Appointment on 05/22/2014  Component Date Value Ref Range Status  . WBC 05/22/2014 4.5  3.9 - 10.3 10e3/uL Final  . NEUT# 05/22/2014 3.1  1.5 - 6.5 10e3/uL Final  . HGB 05/22/2014 11.9  11.6 - 15.9 g/dL Final  . HCT 05/22/2014 36.8  34.8 - 46.6 % Final  . Platelets 05/22/2014 232  145 - 400 10e3/uL Final  . MCV 05/22/2014 97.6  79.5 - 101.0 fL Final  . MCH 05/22/2014 31.6  25.1 - 34.0 pg Final   . MCHC 05/22/2014 32.3  31.5 - 36.0 g/dL Final  . RBC 05/22/2014 3.77  3.70 - 5.45 10e6/uL Final  . RDW 05/22/2014 13.1  11.2 - 14.5 % Final  . lymph# 05/22/2014 1.0  0.9 - 3.3 10e3/uL Final  . MONO# 05/22/2014 0.4  0.1 - 0.9 10e3/uL Final  . Eosinophils Absolute 05/22/2014 0.1  0.0 - 0.5 10e3/uL Final  . Basophils Absolute 05/22/2014 0.0  0.0 - 0.1 10e3/uL Final  . NEUT%  05/22/2014 67.2  38.4 - 76.8 % Final  . LYMPH% 05/22/2014 21.8  14.0 - 49.7 % Final  . MONO% 05/22/2014 7.7  0.0 - 14.0 % Final  . EOS% 05/22/2014 2.9  0.0 - 7.0 % Final  . BASO% 05/22/2014 0.4  0.0 - 2.0 % Final  . Sodium 05/22/2014 139  136 - 145 mEq/L Final  . Potassium 05/22/2014 4.3  3.5 - 5.1 mEq/L Final  . Chloride 05/22/2014 103  98 - 109 mEq/L Final  . CO2 05/22/2014 28  22 - 29 mEq/L Final  . Glucose 05/22/2014 115  70 - 140 mg/dl Final  . BUN 05/22/2014 22.8  7.0 - 26.0 mg/dL Final  . Creatinine 05/22/2014 0.8  0.6 - 1.1 mg/dL Final  . Total Bilirubin 05/22/2014 0.24  0.20 - 1.20 mg/dL Final  . Alkaline Phosphatase 05/22/2014 70  40 - 150 U/L Final  . AST 05/22/2014 22  5 - 34 U/L Final  . ALT 05/22/2014 15  0 - 55 U/L Final  . Total Protein 05/22/2014 6.8  6.4 - 8.3 g/dL Final  . Albumin 05/22/2014 4.1  3.5 - 5.0 g/dL Final  . Calcium 05/22/2014 10.1  8.4 - 10.4 mg/dL Final  . Anion Gap 05/22/2014 9  3 - 11 mEq/L Final     RADIOGRAPHIC STUDIES: No results found.  ASSESSMENT/PLAN:    Breast cancer of upper-outer quadrant of left female breast Patient is status post left nipple sparing lumpectomy in September 2015 per Dr. Brantley Stage; and radiation therapy completed in November 2015.  Initiated tamoxifen therapy in January 2016.  Patient has plans to return for labs only on 09/11/2014.  Patient will return for follow-up visit with Dr. Jana Hakim on 10/01/2014.     Breast discharge Patient underwent a left nipple sparing lumpectomy in September 2015 per Dr. Brantley Stage.  Patient has had issues with the  lumpectomy surgical site; as well as the sutures around the left nipple for the past several months.  She states within the past 2 weeks she has developed purulent, golden discharge/drainage from the nipple suture line; as well as from the actual nipple itself.  She also feels that her left breast itself has increased in size recently.  She is also complaining of a chronic rash to the left breast; which is now increased.  She has also noted that her entire left nipple area has become very red and tender as well.  She denies any recent fevers or chills.  On exam-patient's left breast lumpectomy site well healed; with no evidence of dehiscence or infection.  The left breast nipple suture line at approximately the 5:00 position does appear to have some drainage that has dried on her breast.  There is no obvious nipple discharge on exam.  Patient does have a scattered pink/red rash to her entire left breast region.  Her left nipple area with increased erythema and mild tenderness on exam. The veins to the left side of the chest are slightly distended as well- but patient states that this is her baseline for the past several months.  Also, her left nipple is now pointing upward; and patient states that this is a new change within the last 2-3 weeks.  Patient will be placed on Augmentin twice daily; and follow-up with Dr. Barry Dienes at [facility] surgery on Thursday, 08/22/2014 for further evaluation and management.  Advised patient to call/return or go directly to the emergency department she develops any worsening symptoms whatsoever.    Patient stated understanding  of all instructions; and was in agreement with this plan of care. The patient knows to call the clinic with any problems, questions or concerns.   Review/collaboration with Dr. Jana Hakim regarding all aspects of patient's visit today.   Total time spent with patient was 40 minutes;  with greater than 75 percent of that time spent in face to  face counseling regarding patient's symptoms,  and coordination of care and follow up.  Disclaimer: This note was dictated with voice recognition software. Similar sounding words can inadvertently be transcribed and may not be corrected upon review.   Drue Second, NP 08/21/2014

## 2014-08-22 ENCOUNTER — Other Ambulatory Visit: Payer: Self-pay | Admitting: Surgery

## 2014-08-22 ENCOUNTER — Other Ambulatory Visit (INDEPENDENT_AMBULATORY_CARE_PROVIDER_SITE_OTHER): Payer: Self-pay | Admitting: General Surgery

## 2014-08-22 DIAGNOSIS — Z853 Personal history of malignant neoplasm of breast: Secondary | ICD-10-CM

## 2014-08-22 DIAGNOSIS — N6452 Nipple discharge: Secondary | ICD-10-CM

## 2014-08-22 NOTE — Addendum Note (Signed)
Addended by: Stark Klein on: 08/22/2014 05:02 PM   Modules accepted: Orders

## 2014-08-23 ENCOUNTER — Ambulatory Visit
Admission: RE | Admit: 2014-08-23 | Discharge: 2014-08-23 | Disposition: A | Discharge status: Home / Self Care | Payer: Medicare Other | Source: Ambulatory Visit | Attending: Surgery | Admitting: Surgery

## 2014-08-23 ENCOUNTER — Other Ambulatory Visit: Payer: Self-pay | Admitting: General Surgery

## 2014-08-23 DIAGNOSIS — N6452 Nipple discharge: Secondary | ICD-10-CM

## 2014-09-02 ENCOUNTER — Telehealth: Payer: Self-pay | Admitting: *Deleted

## 2014-09-02 NOTE — Telephone Encounter (Signed)
THE DAY AFTER PT. FINISHED A TEN DAY COURSE OF ANTIBIOTICS PRESCRIBED BY CINDEE BACON,NP; PT.'S LEFT NIPPLE WAS SWOLLEN WITH OCCASIONAL YELLOW DRAINAGE. THERE IS A DARK RED RASH UNDERNEATH HER NIPPLE. SHE IS ALSO CONCERNED WITH THE DIMPLING IN HER BREAST. PT. HAS HAD THE ULTRASOUND DONE ON HER LEFT BREAST WHICH SHOWED A "SPOT NEAR WHERE HER CANCER WAS". SHE IS TO FOLLOW UP IN THREE MONTHS. PT. BECAME TEARFUL.PT. SEES DR.MAGRINAT ON 09/29/14. PT HAS AN APPOINTMENT WITH HER EYE DOCTOR AT 1:00PM IN [place].

## 2014-09-02 NOTE — Telephone Encounter (Signed)
SPOKE WITH DR.MAGRINAT'S NURSE, VAL DODD,RN. INSTRUCTED PT. TO CALL HER THE SURGEON'S OFFICE WITH AN UPDATE ON HER CONDITION. SHE VOICES UNDERSTANDING.

## 2014-09-30 ENCOUNTER — Other Ambulatory Visit: Payer: Self-pay | Admitting: *Deleted

## 2014-09-30 DIAGNOSIS — C50412 Malignant neoplasm of upper-outer quadrant of left female breast: Secondary | ICD-10-CM

## 2014-10-01 ENCOUNTER — Other Ambulatory Visit (HOSPITAL_BASED_OUTPATIENT_CLINIC_OR_DEPARTMENT_OTHER): Payer: Medicare Other

## 2014-10-01 ENCOUNTER — Telehealth: Payer: Self-pay | Admitting: Oncology

## 2014-10-01 ENCOUNTER — Ambulatory Visit (HOSPITAL_BASED_OUTPATIENT_CLINIC_OR_DEPARTMENT_OTHER): Payer: Medicare Other | Admitting: Oncology

## 2014-10-01 VITALS — BP 154/85 | HR 66 | Temp 98.2°F | Resp 18 | Ht 62.0 in | Wt 126.5 lb

## 2014-10-01 DIAGNOSIS — Z853 Personal history of malignant neoplasm of breast: Secondary | ICD-10-CM | POA: Diagnosis not present

## 2014-10-01 DIAGNOSIS — I10 Essential (primary) hypertension: Secondary | ICD-10-CM

## 2014-10-01 DIAGNOSIS — C50412 Malignant neoplasm of upper-outer quadrant of left female breast: Secondary | ICD-10-CM

## 2014-10-01 DIAGNOSIS — E038 Other specified hypothyroidism: Secondary | ICD-10-CM

## 2014-10-01 DIAGNOSIS — N6452 Nipple discharge: Secondary | ICD-10-CM

## 2014-10-01 LAB — CBC WITH DIFFERENTIAL/PLATELET
BASO%: 0.9 % (ref 0.0–2.0)
BASOS ABS: 0 10*3/uL (ref 0.0–0.1)
EOS ABS: 0.1 10*3/uL (ref 0.0–0.5)
EOS%: 2.7 % (ref 0.0–7.0)
HCT: 35.9 % (ref 34.8–46.6)
HGB: 11.6 g/dL (ref 11.6–15.9)
LYMPH%: 21.8 % (ref 14.0–49.7)
MCH: 31.6 pg (ref 25.1–34.0)
MCHC: 32.5 g/dL (ref 31.5–36.0)
MCV: 97.2 fL (ref 79.5–101.0)
MONO#: 0.4 10*3/uL (ref 0.1–0.9)
MONO%: 7.5 % (ref 0.0–14.0)
NEUT#: 3.2 10*3/uL (ref 1.5–6.5)
NEUT%: 67.1 % (ref 38.4–76.8)
Platelets: 251 10*3/uL (ref 145–400)
RBC: 3.69 10*6/uL — AB (ref 3.70–5.45)
RDW: 13.1 % (ref 11.2–14.5)
WBC: 4.8 10*3/uL (ref 3.9–10.3)
lymph#: 1 10*3/uL (ref 0.9–3.3)

## 2014-10-01 LAB — COMPREHENSIVE METABOLIC PANEL (CC13)
ALT: 12 U/L (ref 0–55)
ANION GAP: 8 meq/L (ref 3–11)
AST: 22 U/L (ref 5–34)
Albumin: 3.8 g/dL (ref 3.5–5.0)
Alkaline Phosphatase: 44 U/L (ref 40–150)
BILIRUBIN TOTAL: 0.26 mg/dL (ref 0.20–1.20)
BUN: 15.7 mg/dL (ref 7.0–26.0)
CALCIUM: 9.3 mg/dL (ref 8.4–10.4)
CHLORIDE: 106 meq/L (ref 98–109)
CO2: 27 mEq/L (ref 22–29)
Creatinine: 0.8 mg/dL (ref 0.6–1.1)
EGFR: 75 mL/min/{1.73_m2} — ABNORMAL LOW (ref >90)
GLUCOSE: 91 mg/dL (ref 70–140)
Potassium: 4.6 mEq/L (ref 3.5–5.1)
Sodium: 141 mEq/L (ref 136–145)
Total Protein: 6.5 g/dL (ref 6.4–8.3)

## 2014-10-01 NOTE — Telephone Encounter (Signed)
per pof to sch pt appt-gave pt copy of sch °

## 2014-10-01 NOTE — Progress Notes (Signed)
[facility]  [phone] [phone]) [phone]     ID: Zoe Boone DOB: June 09, 1941  MR#: [id number]  IWO#:[id number]  Patient Care Team: Raina Mina, MD as PCP - General (Internal Medicine) Chauncey Cruel, MD as Consulting Physician (Oncology) Erroll Luna, MD as Consulting Physician (General Surgery) Thea Silversmith, MD as Consulting Physician (Radiation Oncology) Richardo Priest, MD as Referring Physician (Cardiology)  OTHER MD: Gatha Mayer M.D., Everlene Farrier M.D.  CHIEF COMPLAINT: estrogen receptor positive breast cancer  CURRENT TREATMENT: tamoaxifen  BREAST CANCER HISTORY: From the original intake note:  "Zoe Boone" had routine screening mammography in [facility] showing a possible mass in the upper left breast. Additional views and left breast ultrasonography confirmed a 7 mm mass in the upper-outer quadrant. This was biopsied under ultrasound guidance in [facility] 01/17/2014, and the pathology showed ([id number]) an invasive ductal carcinoma, E-cadherin positive, grade 1 or 2, estrogen receptor 99% positive, progesterone receptor 14% positive, with no amplification of HER-2, and with an MIB-1 of 17%.  On 02/19/2014 the patient underwent bilateral breast MRI. This showed a signal void in the superior left breast, but no abnormal enhancement, suggesting that most if not all of the original lesion was removed and biopsied.  The patient's subsequent history is as detailed below  INTERVAL HISTORY: Zoe Boone returns today for follow-up of her breast cancer. She started tamoxifen the first week in January 2016. She was able to obtain it for about a dollars a month, which is a good price. She is having no problems with hot flashes. She thinks she may have had a slight vaginal discharge associated with this. Recall she is status post hysterectomy.  REVIEW OF SYSTEMS: Zoe Boone had significant problems related to her left breast surgery.  Apparently she had an allergic dermatitis arising from the "glue" used to keep the wound closed. She finally saw dermatology for this and was started on endocrine, which is helping. She does not recall however the name of the dermatologist or the name of the cream. Aside from that she is having aches and pains here and there, including her hips. She has some vaginal discomfort which she is going to be discussing with her gynecologist in [place]. She also has an appointment with Dr. Gaetano Net in May. Otherwise a detailed review of systems today was stable  PAST MEDICAL HISTORY: Past Medical History  Diagnosis Date  . Anxiety   . Hypertension   . Cancer [phone]    left breast mammary carcinoma  . GERD (gastroesophageal reflux disease)   . Hyperlipidemia   . Thyroid disease   . PONV (postoperative nausea and vomiting)   . Arthritis     PAST SURGICAL HISTORY: Past Surgical History  Procedure Laterality Date  . Cataract extraction      bilat.   . Colonoscopy  2014  . Knee arthroscopy  2010    right   . Bladder suspension  1992  . Abdominal hysterectomy  1985  . Bunionectomy  1990    lt/rt  . Breast surgery      multiple br bx    FAMILY HISTORY Family History  Problem Relation Age of Onset  . Leukemia Father   . Diabetes Father   . Cancer Father     luekemia  . Skin cancer Mother   . Hypertension Mother   . Kidney cancer Brother   . Melanoma Brother    the patient's father died from complications of chronic leukemia at the age of  58. He had been diagnosed at age 51. The patient's mother died from "old age" at age 63. The patient has 3 brothers and 2 sisters. One brother was diagnosed with kidney cancer at the age of 78. There is one maternal aunt (out of 2) with breast cancer diagnosed at age 5, , and one paternal aunt (40) diagnosed with breast cancer at age 4. There is no history of ovarian cancer in the family.  GYNECOLOGIC HISTORY:  No LMP recorded. Patient has had a  hysterectomy. Menarche age 40, first live birth age 71. The patient is GX P2. She underwent abdominal hysterectomy and subsequent bilateral salpingo-oophorectomy at age 18. She took hormone replacement for approximately 10 years.  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 Her husband Zoe Boone "Zoe Olea" Boone is retired from [employer]. It suggests that 2 of them at home, with no pets. Their daughter Zoe Boone is a Education officer, museum in [place], as is your second daughter, Zoe Boone. The patient has 4 grandchildren, including a granddaughter who is just entering medical school in [place], 2 grandsons at [employer], and a fourth is a Print production planner. The patient attends a local [name]     ADVANCED DIRECTIVES: Not in place   HEALTH MAINTENANCE: History  Substance Use Topics  . Smoking status: Never Smoker   . Smokeless tobacco: Not on file  . Alcohol Use: No     Colonoscopy: Up to date/Zoe Boone  PAP:  Bone density:  Lipid panel:  Allergies  Allergen Reactions  . Aspirin Nausea Only    Gi upset    Current Outpatient Prescriptions  Medication Sig Dispense Refill  . ALPRAZolam (XANAX) 0.25 MG tablet Take 0.25 mg by mouth at bedtime as needed for anxiety.    Marland Kitchen amoxicillin-clavulanate (AUGMENTIN) 875-125 MG per tablet Take 1 tablet by mouth 2 (two) times daily. 20 tablet 0  . benazepril (LOTENSIN) 20 MG tablet Take 20 mg by mouth daily.    . Cholecalciferol 1000 UNITS capsule Take 1,000 Units by mouth daily.    Marland Kitchen levothyroxine (SYNTHROID, LEVOTHROID) 75 MCG tablet Take 75 mcg by mouth daily before breakfast.    . metoprolol succinate (TOPROL-XL) 25 MG 24 hr tablet Take 25 mg by mouth daily.    . Multiple Vitamin (MULTIVITAMIN) tablet Take 1 tablet by mouth daily.    . nitroGLYCERIN (NITROSTAT) 0.4 MG SL tablet Place 0.4 mg under the tongue every 5 (five) minutes as needed for chest pain.    Marland Kitchen  omeprazole (PRILOSEC) 20 MG capsule Take 20 mg by mouth daily.    . pravastatin (PRAVACHOL) 40 MG tablet Take 40 mg by mouth daily.    . temazepam (RESTORIL) 15 MG capsule Take 15 mg by mouth at bedtime as needed for sleep.    . valACYclovir (VALTREX) 500 MG tablet Take 500 mg by mouth 2 (two) times daily.     No current facility-administered medications for this visit.    OBJECTIVE: Middle-aged white woman who appears stated age 6 Vitals:   10/01/14 1103  BP: 154/85  Pulse: 66  Temp: 98.2 F (36.8 C)  Resp: 18     Body mass index is 23.13 kg/(m^2).    ECOG FS:1 - Symptomatic but completely ambulatory  Sclerae unicteric, pupils round and equal Oropharynx clear and moist No cervical or supraclavicular adenopathy Lungs no rales or rhonchi Heart regular rate and rhythm Abd soft, nontender, positive  bowel sounds MSK no focal spinal tenderness, no upper extremity lymphedema Neuro: nonfocal, well oriented, anxious affect Breasts: The right breast is unremarkable. The left breast is status post lumpectomy and radiation. There is residual erythema surrounding the nipple, which is imaged below. There is some induration of the skin below the area of dermatitis, particularly superiorly. This requires only follow-up. The left axilla is benign.       LAB RESULTS:  CMP     Component Value Date/Time   NA 141 10/01/2014 1034   NA 140 03/19/2014 1100   K 4.6 10/01/2014 1034   K 4.5 03/19/2014 1100   CL 100 03/19/2014 1100   CO2 27 10/01/2014 1034   CO2 29 03/19/2014 1100   GLUCOSE 91 10/01/2014 1034   GLUCOSE 89 03/19/2014 1100   BUN 15.7 10/01/2014 1034   BUN 17 03/19/2014 1100   CREATININE 0.8 10/01/2014 1034   CREATININE 0.74 03/19/2014 1100   CALCIUM 9.3 10/01/2014 1034   CALCIUM 10.1 03/19/2014 1100   PROT 6.5 10/01/2014 1034   PROT 7.5 03/19/2014 1100   ALBUMIN 3.8 10/01/2014 1034   ALBUMIN 4.4 03/19/2014 1100   AST 22 10/01/2014 1034   AST 25 03/19/2014 1100   ALT  12 10/01/2014 1034   ALT 17 03/19/2014 1100   ALKPHOS 44 10/01/2014 1034   ALKPHOS 75 03/19/2014 1100   BILITOT 0.26 10/01/2014 1034   BILITOT 0.2* 03/19/2014 1100   GFRNONAA 84* 03/19/2014 1100   GFRAA >90 03/19/2014 1100    I No results found for: SPEP  Lab Results  Component Value Date   WBC 4.8 10/01/2014   NEUTROABS 3.2 10/01/2014   HGB 11.6 10/01/2014   HCT 35.9 10/01/2014   MCV 97.2 10/01/2014   PLT 251 10/01/2014      Chemistry      Component Value Date/Time   NA 141 10/01/2014 1034   NA 140 03/19/2014 1100   K 4.6 10/01/2014 1034   K 4.5 03/19/2014 1100   CL 100 03/19/2014 1100   CO2 27 10/01/2014 1034   CO2 29 03/19/2014 1100   BUN 15.7 10/01/2014 1034   BUN 17 03/19/2014 1100   CREATININE 0.8 10/01/2014 1034   CREATININE 0.74 03/19/2014 1100      Component Value Date/Time   CALCIUM 9.3 10/01/2014 1034   CALCIUM 10.1 03/19/2014 1100   ALKPHOS 44 10/01/2014 1034   ALKPHOS 75 03/19/2014 1100   AST 22 10/01/2014 1034   AST 25 03/19/2014 1100   ALT 12 10/01/2014 1034   ALT 17 03/19/2014 1100   BILITOT 0.26 10/01/2014 1034   BILITOT 0.2* 03/19/2014 1100       No results found for: LABCA2  No components found for: LABCA125  No results for input(s): INR in the last 168 hours.  Urinalysis No results found for: COLORURINE  STUDIES:  CLINICAL DATA: Malignant lumpectomy of the upper outer quadrant of the left breast in September, 2015 with adjuvant radiation therapy. Patient presents today with an approximate 1 month history of yellow brown left nipple discharge from multiple duct orifices and erythema and itching of the left nipple and periareolar skin.  EXAM: DIGITAL DIAGNOSTIC LEFT MAMMOGRAM WITH CAD  ULTRASOUND LEFT BREAST  COMPARISON: Mammography 03/19/2014 dating back to 07/04/2007. Left breast ultrasound 01/16/2014, 01/14/2014. Prior imaging has been performed at [facility].  ACR  Breast Density Category c: The breast tissue is heterogeneously dense, which may obscure small masses.  FINDINGS:  CC and MLO views of the left breast were obtained. Post lumpectomy scarring in the upper outer left breast and extensive post radiation trabecular thickening throughout the left breast. No findings suspicious for malignancy.  Mammographic images were processed with CAD.  On physical exam, there is patchy erythema involving the skin around the left nipple, and the nipple is erythematous. I was able to express minimal discharge from multiple duct orifices on the nipple and the lower areola.  Targeted ultrasound is performed, showing extensive interstitial edema in the subareolar left breast. At the 8 o'clock position approximately 1 cm from the nipple is a circumscribed horizontally oriented hypoechoic mass without acoustic shadowing measuring approximately 4 x 3 x 3 mm. There is no internal color Doppler flow. No suspicious solid mass or abnormal acoustic shadowing was identified.  IMPRESSION: 1. Minimal discharge from multiple duct orifices on the left nipple and areola. Nipple discharges that occur from multiple orifices are typically benign. 2. Interstitial edema throughout the subareolar left breast, likely secondary to radiation as there is extensive post radiation trabecular thickening throughout the left breast. 3. Likely benign oil cyst at the 8 o'clock subareolar left breast.  RECOMMENDATION: Left breast ultrasound in 3 months to confirm expected evolutionary changes of presumed post radiation interstitial edema.  I have discussed the findings and recommendations with the patient. Results were also provided in writing at the conclusion of the visit. If applicable, a reminder letter will be sent to the patient regarding the next appointment.  BI-RADS CATEGORY 3: Probably benign.   Electronically Signed  By: Evangeline Dakin M.D.  On:  08/23/2014 15:36     ASSESSMENT: 74 y.o.  woman s/p left upper-outer quadrant biopsy 01/16/2014 for a clinical T1b N0, stage IA invasive ductal breast cancer  (1) status post left lumpectomy and sentinel lymph node sampling 03/21/2014 for a pT1b pN0, stage IA invasive ductal carcinoma, repeat HER-2 again negative.  (2) adjuvant radiation completed 06/17/2014 in [place]  (3) started tamoxifen 07/05/2014  (a) status post remote TAH-BSO  PLAN: Zoe Boone is doing fine on tamoxifen and the plan will be to continue that for at least 5 years, at which time we will decide whether to stop, continue another 5 years, or switch to an aromatase inhibitor for 5 years.  She understands that anti-estrogens like tamoxifen will cut in half her risk of this cancer coming back. She knows also that that risk is low to begin with. Accordingly overall her prognosis is good.  She had a hard time with her left nipple problems. Luckily that appears to be resolving. She is scheduled for repeat ultrasound of the left breast in May and we will of course review that result. She will have her routine mammography in July and we will see her late July to review those results as well. From that point we can start seeing her on an every three-month basis alternating with her surgeon  Zoe Boone has a good understanding of this plan. She agrees with it. She knows the goal of treatment in her case is cure. She will call with any problems that may develop before her next visit here.  Chauncey Cruel, MD   10/01/2014 11:11 AM

## 2014-10-14 ENCOUNTER — Other Ambulatory Visit: Payer: Self-pay | Admitting: General Surgery

## 2014-10-14 DIAGNOSIS — N6002 Solitary cyst of left breast: Secondary | ICD-10-CM

## 2014-11-07 ENCOUNTER — Ambulatory Visit
Admission: RE | Admit: 2014-11-07 | Discharge: 2014-11-07 | Disposition: A | Discharge status: Home / Self Care | Payer: Medicare Other | Source: Ambulatory Visit | Attending: General Surgery | Admitting: General Surgery

## 2014-11-07 DIAGNOSIS — N6002 Solitary cyst of left breast: Secondary | ICD-10-CM

## 2014-12-20 ENCOUNTER — Other Ambulatory Visit: Payer: Self-pay | Admitting: Oncology

## 2014-12-20 DIAGNOSIS — Z9889 Other specified postprocedural states: Secondary | ICD-10-CM

## 2015-01-13 ENCOUNTER — Ambulatory Visit: Payer: Medicare Other | Admitting: Nurse Practitioner

## 2015-01-13 ENCOUNTER — Other Ambulatory Visit: Payer: Medicare Other

## 2015-01-20 ENCOUNTER — Other Ambulatory Visit: Payer: Self-pay | Admitting: Oncology

## 2015-01-20 ENCOUNTER — Ambulatory Visit
Admission: RE | Admit: 2015-01-20 | Discharge: 2015-01-20 | Disposition: A | Discharge status: Home / Self Care | Payer: Medicare Other | Source: Ambulatory Visit | Attending: Oncology | Admitting: Oncology

## 2015-01-20 DIAGNOSIS — Z9889 Other specified postprocedural states: Secondary | ICD-10-CM

## 2015-01-24 ENCOUNTER — Other Ambulatory Visit: Payer: Self-pay | Admitting: *Deleted

## 2015-01-24 DIAGNOSIS — C50412 Malignant neoplasm of upper-outer quadrant of left female breast: Secondary | ICD-10-CM

## 2015-01-27 ENCOUNTER — Telehealth: Payer: Self-pay | Admitting: Nurse Practitioner

## 2015-01-27 ENCOUNTER — Ambulatory Visit (HOSPITAL_BASED_OUTPATIENT_CLINIC_OR_DEPARTMENT_OTHER): Payer: Medicare Other

## 2015-01-27 ENCOUNTER — Encounter: Payer: Self-pay | Admitting: Nurse Practitioner

## 2015-01-27 ENCOUNTER — Ambulatory Visit (HOSPITAL_BASED_OUTPATIENT_CLINIC_OR_DEPARTMENT_OTHER): Payer: Medicare Other | Admitting: Nurse Practitioner

## 2015-01-27 VITALS — BP 162/96 | HR 68 | Temp 98.4°F | Resp 18 | Ht 62.0 in | Wt 124.7 lb

## 2015-01-27 DIAGNOSIS — C50412 Malignant neoplasm of upper-outer quadrant of left female breast: Secondary | ICD-10-CM

## 2015-01-27 DIAGNOSIS — Z79810 Long term (current) use of selective estrogen receptor modulators (SERMs): Secondary | ICD-10-CM | POA: Diagnosis not present

## 2015-01-27 DIAGNOSIS — Z853 Personal history of malignant neoplasm of breast: Secondary | ICD-10-CM

## 2015-01-27 LAB — CBC WITH DIFFERENTIAL/PLATELET
BASO%: 0.8 % (ref 0.0–2.0)
Basophils Absolute: 0 10*3/uL (ref 0.0–0.1)
EOS ABS: 0.1 10*3/uL (ref 0.0–0.5)
EOS%: 1.4 % (ref 0.0–7.0)
HEMATOCRIT: 35.1 % (ref 34.8–46.6)
HEMOGLOBIN: 11.3 g/dL — AB (ref 11.6–15.9)
LYMPH#: 1.2 10*3/uL (ref 0.9–3.3)
LYMPH%: 34.4 % (ref 14.0–49.7)
MCH: 31.4 pg (ref 25.1–34.0)
MCHC: 32.2 g/dL (ref 31.5–36.0)
MCV: 97.5 fL (ref 79.5–101.0)
MONO#: 0.3 10*3/uL (ref 0.1–0.9)
MONO%: 7.8 % (ref 0.0–14.0)
NEUT#: 2 10*3/uL (ref 1.5–6.5)
NEUT%: 55.6 % (ref 38.4–76.8)
PLATELETS: 225 10*3/uL (ref 145–400)
RBC: 3.6 10*6/uL — AB (ref 3.70–5.45)
RDW: 13.1 % (ref 11.2–14.5)
WBC: 3.6 10*3/uL — AB (ref 3.9–10.3)

## 2015-01-27 LAB — COMPREHENSIVE METABOLIC PANEL (CC13)
ALK PHOS: 42 U/L (ref 40–150)
ALT: 9 U/L (ref 0–55)
AST: 19 U/L (ref 5–34)
Albumin: 3.8 g/dL (ref 3.5–5.0)
Anion Gap: 5 mEq/L (ref 3–11)
BUN: 14.2 mg/dL (ref 7.0–26.0)
CHLORIDE: 105 meq/L (ref 98–109)
CO2: 29 mEq/L (ref 22–29)
Calcium: 9.5 mg/dL (ref 8.4–10.4)
Creatinine: 0.8 mg/dL (ref 0.6–1.1)
EGFR: 75 mL/min/{1.73_m2} — ABNORMAL LOW (ref >90)
Glucose: 90 mg/dl (ref 70–140)
POTASSIUM: 5.4 meq/L — AB (ref 3.5–5.1)
SODIUM: 140 meq/L (ref 136–145)
Total Bilirubin: 0.34 mg/dL (ref 0.20–1.20)
Total Protein: 6.4 g/dL (ref 6.4–8.3)

## 2015-01-27 NOTE — Progress Notes (Signed)
[place]  Telephone:(336) [phone] Fax:(336) [phone]     ID: PETA PEACHEY DOB: 1941/04/08  MR#: [id number]  JLL#:[id number]  Patient Care Team: Raina Mina, MD as PCP - General (Internal Medicine) Chauncey Cruel, MD as Consulting Physician (Oncology) Thea Silversmith, MD as Consulting Physician (Radiation Oncology) Richardo Priest, MD as Referring Physician (Cardiology) Fanny Skates, MD as Consulting Physician (General Surgery)  OTHER MD: Gatha Mayer M.D., Everlene Farrier M.D.  CHIEF COMPLAINT: estrogen receptor positive breast cancer  CURRENT TREATMENT: tamoaxifen  BREAST CANCER HISTORY: From the original intake note:  "[name]" had routine screening mammography in [facility] showing a possible mass in the upper left breast. Additional views and left breast ultrasonography confirmed a 7 mm mass in the upper-outer quadrant. This was biopsied under ultrasound guidance in [facility] 01/17/2014, and the pathology showed ([id number]) an invasive ductal carcinoma, E-cadherin positive, grade 1 or 2, estrogen receptor 99% positive, progesterone receptor 14% positive, with no amplification of HER-2, and with an MIB-1 of 17%.  On 02/19/2014 the patient underwent bilateral breast MRI. This showed a signal void in the superior left breast, but no abnormal enhancement, suggesting that most if not all of the original lesion was removed and biopsied.  The patient's subsequent history is as detailed below  INTERVAL HISTORY: [name] for follow-up of her breast cancer. She has been on tamoxifen since January of this year and is tolerating it relatively well. She has some mild hot flashes, but nothing she cant handle. She has an increase in vaginal discharge which is typical while on tamoxifen, but lately this discharge has been brown. She is meeting with her GYN specialist for further investigation of this finding. The interval history is otherwise  unremarkable.  REVIEW OF SYSTEMS: [name] denies fevers, chills, nausea, vomiting, or changes in bowel or bladder habits. Her blood pressure is elevated today, but she states she may have forgotten to take her blood pressure medicines today. She denies headaches, dizziness, or vision changes. She has no shortness of breath, chest pain, cough, or palpitations. She has generalized joint pains here and there. A detailed review of systems is otherwise stable.  PAST MEDICAL HISTORY: Past Medical History  Diagnosis Date  . Anxiety   . Hypertension   . Cancer [phone]    left breast mammary carcinoma  . GERD (gastroesophageal reflux disease)   . Hyperlipidemia   . Thyroid disease   . PONV (postoperative nausea and vomiting)   . Arthritis     PAST SURGICAL HISTORY: Past Surgical History  Procedure Laterality Date  . Cataract extraction      bilat.   . Colonoscopy  2014  . Knee arthroscopy  2010    right   . Bladder suspension  1992  . Abdominal hysterectomy  1985  . Bunionectomy  1990    lt/rt  . Breast surgery      multiple br bx    FAMILY HISTORY Family History  Problem Relation Age of Onset  . Leukemia Father   . Diabetes Father   . Cancer Father     luekemia  . Skin cancer Mother   . Hypertension Mother   . Kidney cancer Brother   . Melanoma Brother    the patient's father died from complications of chronic leukemia at the age of 79. He had been diagnosed at age 59. The patient's mother died from "old age" at age 39. The patient has 3 brothers and 2 sisters. One  brother was diagnosed with kidney cancer at the age of 22. There is one maternal aunt (out of 2) with breast cancer diagnosed at age 10, , and one paternal aunt (21) diagnosed with breast cancer at age 40. There is no history of ovarian cancer in the family.  GYNECOLOGIC HISTORY:  No LMP recorded. Patient has had a hysterectomy. Menarche age 74, first live birth age 74. The patient is GX P2. She underwent abdominal  hysterectomy and subsequent bilateral salpingo-oophorectomy at age 53. She took hormone replacement for approximately 10 years.  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 Her husband [name] "Marguerite Olea" Iwata is retired from [employer]. It suggests that 2 of them at home, with no pets. Their daughter [name] is a Education officer, museum in [place], as is your second daughter, [name]. The patient has 4 grandchildren, including a granddaughter who is just entering medical school in [place], 2 grandsons at [employer], and a fourth is a Print production planner. The patient attends a local [name]     ADVANCED DIRECTIVES: Not in place   HEALTH MAINTENANCE: History  Substance Use Topics  . Smoking status: Never Smoker   . Smokeless tobacco: Not on file  . Alcohol Use: No     Colonoscopy: Up to date/[name]  PAP:  Bone density:  Lipid panel:  Allergies  Allergen Reactions  . Aspirin Nausea Only    Gi upset    Current Outpatient Prescriptions  Medication Sig Dispense Refill  . ALPRAZolam (XANAX) 0.25 MG tablet Take 0.25 mg by mouth at bedtime as needed for anxiety.    . benazepril (LOTENSIN) 20 MG tablet Take 20 mg by mouth daily.    . Calcium Carbonate-Vit D-Min (CALCIUM 1200 PO) Take 1 tablet by mouth daily.    . Cholecalciferol 1000 UNITS capsule Take 2,000 Units by mouth daily.     Marland Kitchen levothyroxine (SYNTHROID, LEVOTHROID) 75 MCG tablet Take 75 mcg by mouth daily before breakfast.    . metoprolol tartrate (LOPRESSOR) 25 MG tablet Take 25 mg by mouth 2 (two) times daily.  3  . Multiple Vitamin (MULTIVITAMIN) tablet Take 1 tablet by mouth daily.    Marland Kitchen omeprazole (PRILOSEC) 20 MG capsule Take 20 mg by mouth daily as needed.     . pravastatin (PRAVACHOL) 40 MG tablet Take 40 mg by mouth daily.    . tamoxifen (NOLVADEX) 20 MG tablet Take 20 mg by mouth daily.    . temazepam (RESTORIL) 15 MG capsule Take 15 mg  by mouth at bedtime as needed for sleep.    . valACYclovir (VALTREX) 500 MG tablet Take 500 mg by mouth as needed (for flare-ups).     . butalbital-acetaminophen-caffeine (FIORICET, ESGIC) 50-325-40 MG per tablet TAKE 1 OR 2 TABLETS EVERY 6 HOURS AS NEEDED FOR SEVERE HEADACHE  4  . nitroGLYCERIN (NITROSTAT) 0.4 MG SL tablet Place 0.4 mg under the tongue every 5 (five) minutes as needed for chest pain.    . traMADol (ULTRAM) 50 MG tablet Take 50 mg by mouth daily.  0   No current facility-administered medications for this visit.    OBJECTIVE: Middle-aged white woman who appears stated age 42 Vitals:   01/27/15 1210  BP: 162/96  Pulse:   Temp:   Resp:      Body mass index is 22.8 kg/(m^2).    ECOG FS:1 - Symptomatic but completely ambulatory  Skin: warm, dry  HEENT: sclerae anicteric, conjunctivae pink, oropharynx clear. No thrush or mucositis.  Lymph Nodes: No cervical or supraclavicular lymphadenopathy  Lungs: clear to auscultation bilaterally, no rales, wheezes, or rhonci  Heart: regular rate and rhythm  Abdomen: round, soft, non tender, positive bowel sounds  Musculoskeletal: No focal spinal tenderness, no peripheral edema  Neuro: non focal, well oriented, positive affect  Breast: left breast status post lumpectomy and radiation. No evidence of recurrent diease. Left axilla benign. Right breast unremarkable.    LAB RESULTS:  CMP     Component Value Date/Time   NA 140 01/27/2015 1121   NA 140 03/19/2014 1100   K 5.4* 01/27/2015 1121   K 4.5 03/19/2014 1100   CL 100 03/19/2014 1100   CO2 29 01/27/2015 1121   CO2 29 03/19/2014 1100   GLUCOSE 90 01/27/2015 1121   GLUCOSE 89 03/19/2014 1100   BUN 14.2 01/27/2015 1121   BUN 17 03/19/2014 1100   CREATININE 0.8 01/27/2015 1121   CREATININE 0.74 03/19/2014 1100   CALCIUM 9.5 01/27/2015 1121   CALCIUM 10.1 03/19/2014 1100   PROT 6.4 01/27/2015 1121   PROT 7.5 03/19/2014 1100   ALBUMIN 3.8 01/27/2015 1121   ALBUMIN 4.4  03/19/2014 1100   AST 19 01/27/2015 1121   AST 25 03/19/2014 1100   ALT 9 01/27/2015 1121   ALT 17 03/19/2014 1100   ALKPHOS 42 01/27/2015 1121   ALKPHOS 75 03/19/2014 1100   BILITOT 0.34 01/27/2015 1121   BILITOT 0.2* 03/19/2014 1100   GFRNONAA 84* 03/19/2014 1100   GFRAA >90 03/19/2014 1100    I No results found for: SPEP  Lab Results  Component Value Date   WBC 3.6* 01/27/2015   NEUTROABS 2.0 01/27/2015   HGB 11.3* 01/27/2015   HCT 35.1 01/27/2015   MCV 97.5 01/27/2015   PLT 225 01/27/2015      Chemistry      Component Value Date/Time   NA 140 01/27/2015 1121   NA 140 03/19/2014 1100   K 5.4* 01/27/2015 1121   K 4.5 03/19/2014 1100   CL 100 03/19/2014 1100   CO2 29 01/27/2015 1121   CO2 29 03/19/2014 1100   BUN 14.2 01/27/2015 1121   BUN 17 03/19/2014 1100   CREATININE 0.8 01/27/2015 1121   CREATININE 0.74 03/19/2014 1100      Component Value Date/Time   CALCIUM 9.5 01/27/2015 1121   CALCIUM 10.1 03/19/2014 1100   ALKPHOS 42 01/27/2015 1121   ALKPHOS 75 03/19/2014 1100   AST 19 01/27/2015 1121   AST 25 03/19/2014 1100   ALT 9 01/27/2015 1121   ALT 17 03/19/2014 1100   BILITOT 0.34 01/27/2015 1121   BILITOT 0.2* 03/19/2014 1100       No results found for: LABCA2  No components found for: LABCA125  No results for input(s): INR in the last 168 hours.  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 Annual evaluation. Patient states that she has a palpable thickening in the low left axilla which has been present for several months without change.  EXAM: DIGITAL DIAGNOSTIC BILATERAL MAMMOGRAM WITH 3D TOMOSYNTHESIS WITH CAD  ULTRASOUND LEFT AXILLA  COMPARISON:  Mammography 08/23/2014 (left), 01/14/2014 (bilateral) and  earlier. Left axillary ultrasound 01/14/2014.  ACR Breast Density Category b: There are  scattered areas of fibroglandular density.  FINDINGS: CC and MLO views of both breasts with tomosynthesis and a spot magnification tangential view of the lumpectomy site in the upper left breast were obtained. Scarring associated with a developing oil cyst at the lumpectomy site in the upper left breast. No findings suspicious for malignancy in the left breast.  No findings suspicious for malignancy in the right breast.  Mammographic images were processed with CAD.  On physical exam, there is palpable thickening in the left lateral chest wall beneath the left axilla in the area of patient concern, but I do not palpate a discrete mass or lymphadenopathy.  Targeted ultrasound of the lower left axilla is performed, showing normal subcutaneous fat and normal appearing serratus and latissimus muscles in the area of palpable concern. No suspicious solid mass or pathologic lymphadenopathy is identified.  IMPRESSION: 1. No specific mammographic evidence of malignancy involving either breast. 2. Normal-appearing tissues in the area of palpable thickening in the low left axilla. 3. Expected post lumpectomy changes in the upper left breast.  RECOMMENDATION: Bilateral diagnostic mammography in 1 year.  I have discussed the findings and recommendations with the patient. Results were also provided in writing at the conclusion of the visit. If applicable, a reminder letter will be sent to the patient regarding the next appointment.  BI-RADS CATEGORY  2: Benign.   Electronically Signed   By: Evangeline Dakin M.D.   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 Annual evaluation. Patient states that she has a palpable thickening in the  low left axilla which has been present for several months without change.  EXAM: DIGITAL DIAGNOSTIC BILATERAL MAMMOGRAM WITH 3D TOMOSYNTHESIS WITH CAD  ULTRASOUND LEFT AXILLA  COMPARISON:  Mammography 08/23/2014 (left), 01/14/2014 (bilateral) and earlier. Left axillary ultrasound 01/14/2014.  ACR Breast Density Category b: There are scattered areas of fibroglandular density.  FINDINGS: CC and MLO views of both breasts with tomosynthesis and a spot magnification tangential view of the lumpectomy site in the upper left breast were obtained. Scarring associated with a developing oil cyst at the lumpectomy site in the upper left breast. No findings suspicious for malignancy in the left breast.  No findings suspicious for malignancy in the right breast.  Mammographic images were processed with CAD.  On physical exam, there is palpable thickening in the left lateral chest wall beneath the left axilla in the area of patient concern, but I do not palpate a discrete mass or lymphadenopathy.  Targeted ultrasound of the lower left axilla is performed, showing normal subcutaneous fat and normal appearing serratus and latissimus muscles in the area of palpable concern. No suspicious solid mass or pathologic lymphadenopathy is identified.  IMPRESSION: 1. No specific mammographic evidence of malignancy involving either breast. 2. Normal-appearing tissues in the area of palpable thickening in the low left axilla. 3. Expected post lumpectomy changes in the upper left breast.  RECOMMENDATION: Bilateral diagnostic mammography in 1 year.  I have discussed the findings and recommendations with the patient. Results were also provided in writing at the conclusion of the visit. If applicable, a reminder letter will be sent to the patient regarding the next appointment.  BI-RADS CATEGORY  2: Benign.   Electronically Signed   By: Evangeline Dakin M.D.   On: 01/20/2015 11:54  ASSESSMENT: 74 y.o. [place] woman s/p left upper-outer  quadrant biopsy 01/16/2014 for a clinical T1b N0, stage IA invasive ductal breast cancer  (1) status post left lumpectomy and sentinel lymph node sampling 03/21/2014 for a pT1b pN0, stage IA invasive ductal carcinoma, repeat HER-2 again negative.  (2) adjuvant radiation completed 06/17/2014 in [place]  (3) started tamoxifen 07/05/2014  (a) status post remote TAH-BSO  PLAN: [name] is doing well as far as her breast cancer is concerned. She has a mammogram last week that was completely benign. She is tolerating the tamoxifen well and will continue this drug for at least 5 years before considering switching to an aromatase inhibitor.   The labs were reviewed in detail and were relatively stable, however her potassium is just hight at 5.4. She is on benazepril, which could be the culprit. I gave her a copy of her labs and instructed her to call her PCP with this result, to see if any changes are recommended.   [name] will return in 3 months for labs and a follow up visit. She understands and agrees with this plan. She know the goal of treatment in her case is cure. She has been encouraged to call with any issues that might arise before her next visit here.  Laurie Panda, NP   01/27/2015 1:22 PM

## 2015-01-27 NOTE — Telephone Encounter (Signed)
Appointments made and avs printed for patient °

## 2015-02-05 NOTE — Progress Notes (Unsigned)
Sent copy of pt's  DEXA  to medical records to be scanned. Pt had bone density done at [facility] on 01/29/15.

## 2015-04-14 ENCOUNTER — Other Ambulatory Visit: Payer: Self-pay

## 2015-04-14 NOTE — Progress Notes (Signed)
Patient called angry because she is having problems and wants to see Dr. Jana Hakim the day of her appt.  She stated she saw Nira Conn last time and because she is having problems wants to see Dr. Jana Hakim this time.  Sent a POF to schedulers.

## 2015-04-29 ENCOUNTER — Other Ambulatory Visit: Payer: Self-pay | Admitting: *Deleted

## 2015-04-29 DIAGNOSIS — C50412 Malignant neoplasm of upper-outer quadrant of left female breast: Secondary | ICD-10-CM

## 2015-04-30 ENCOUNTER — Encounter: Payer: Self-pay | Admitting: Nurse Practitioner

## 2015-04-30 ENCOUNTER — Telehealth: Payer: Self-pay | Admitting: Nurse Practitioner

## 2015-04-30 ENCOUNTER — Other Ambulatory Visit (HOSPITAL_BASED_OUTPATIENT_CLINIC_OR_DEPARTMENT_OTHER): Payer: Medicare Other

## 2015-04-30 ENCOUNTER — Ambulatory Visit (HOSPITAL_BASED_OUTPATIENT_CLINIC_OR_DEPARTMENT_OTHER): Payer: Medicare Other | Admitting: Nurse Practitioner

## 2015-04-30 ENCOUNTER — Other Ambulatory Visit: Payer: Self-pay | Admitting: *Deleted

## 2015-04-30 VITALS — BP 150/74 | HR 57 | Temp 98.6°F | Resp 18 | Ht 62.0 in | Wt 123.5 lb

## 2015-04-30 DIAGNOSIS — N6012 Diffuse cystic mastopathy of left breast: Secondary | ICD-10-CM | POA: Diagnosis not present

## 2015-04-30 DIAGNOSIS — C50412 Malignant neoplasm of upper-outer quadrant of left female breast: Secondary | ICD-10-CM

## 2015-04-30 DIAGNOSIS — Z853 Personal history of malignant neoplasm of breast: Secondary | ICD-10-CM | POA: Diagnosis not present

## 2015-04-30 DIAGNOSIS — R300 Dysuria: Secondary | ICD-10-CM

## 2015-04-30 LAB — COMPREHENSIVE METABOLIC PANEL (CC13)
ALBUMIN: 3.9 g/dL (ref 3.5–5.0)
ALK PHOS: 40 U/L (ref 40–150)
ALT: 10 U/L (ref 0–55)
AST: 19 U/L (ref 5–34)
Anion Gap: 4 mEq/L (ref 3–11)
BUN: 16.1 mg/dL (ref 7.0–26.0)
CHLORIDE: 105 meq/L (ref 98–109)
CO2: 29 mEq/L (ref 22–29)
Calcium: 9.8 mg/dL (ref 8.4–10.4)
Creatinine: 0.8 mg/dL (ref 0.6–1.1)
EGFR: 75 mL/min/{1.73_m2} — AB (ref >90)
GLUCOSE: 86 mg/dL (ref 70–140)
POTASSIUM: 5.1 meq/L (ref 3.5–5.1)
SODIUM: 139 meq/L (ref 136–145)
Total Bilirubin: <0.3 mg/dL (ref 0.20–1.20)
Total Protein: 6.7 g/dL (ref 6.4–8.3)

## 2015-04-30 LAB — CBC WITH DIFFERENTIAL/PLATELET
BASO%: 1.1 % (ref 0.0–2.0)
BASOS ABS: 0 10*3/uL (ref 0.0–0.1)
EOS ABS: 0.1 10*3/uL (ref 0.0–0.5)
EOS%: 2.5 % (ref 0.0–7.0)
HEMATOCRIT: 36.6 % (ref 34.8–46.6)
HEMOGLOBIN: 12 g/dL (ref 11.6–15.9)
LYMPH#: 1.2 10*3/uL (ref 0.9–3.3)
LYMPH%: 28.6 % (ref 14.0–49.7)
MCH: 31.8 pg (ref 25.1–34.0)
MCHC: 32.8 g/dL (ref 31.5–36.0)
MCV: 97 fL (ref 79.5–101.0)
MONO#: 0.5 10*3/uL (ref 0.1–0.9)
MONO%: 10.9 % (ref 0.0–14.0)
NEUT#: 2.3 10*3/uL (ref 1.5–6.5)
NEUT%: 56.9 % (ref 38.4–76.8)
Platelets: 244 10*3/uL (ref 145–400)
RBC: 3.77 10*6/uL (ref 3.70–5.45)
RDW: 12.8 % (ref 11.2–14.5)
WBC: 4.1 10*3/uL (ref 3.9–10.3)

## 2015-04-30 LAB — URINALYSIS, MICROSCOPIC - CHCC
BILIRUBIN (URINE): NEGATIVE
Blood: NEGATIVE
GLUCOSE UR CHCC: NEGATIVE mg/dL
Ketones: NEGATIVE mg/dL
NITRITE: NEGATIVE
PH: 6.5 (ref 4.6–8.0)
PROTEIN: NEGATIVE mg/dL
Specific Gravity, Urine: 1.01 (ref 1.003–1.035)
Urobilinogen, UR: 0.2 mg/dL (ref 0.2–1)

## 2015-04-30 MED ORDER — CIPROFLOXACIN HCL 500 MG PO TABS: 500.0000 mg | ORAL_TABLET | Freq: Two times a day (BID) | ORAL | Status: DC

## 2015-04-30 NOTE — Telephone Encounter (Signed)
Appointments made and avs printed for patient °

## 2015-04-30 NOTE — Progress Notes (Signed)
Called pt's pharmacy ans she has plenty of refills to last her thru the year for her Tamoxifen 20 mg tablet. Pt is aware.

## 2015-04-30 NOTE — Progress Notes (Signed)
Zoe Boone  [phone] [phone]     ID: Zoe Boone DOB: May 18, 1941  MR#: [id number]  KDT#:[id number]  Patient Care Team: Zoe Mina, MD as PCP - General (Internal Medicine) Zoe Cruel, MD as Consulting Physician (Oncology) Zoe Silversmith, MD as Consulting Physician (Radiation Oncology) Zoe Priest, MD as Referring Physician (Cardiology) Boone Skates, MD as Consulting Physician (General Surgery)  OTHER MD: Zoe Boone M.D., Zoe Boone M.D.  CHIEF COMPLAINT: estrogen receptor positive breast cancer  CURRENT TREATMENT: tamoaxifen  BREAST CANCER HISTORY: From the original intake note:  "Zoe Boone" had routine screening mammography in [facility] showing a possible mass in the upper left breast. Additional views and left breast ultrasonography confirmed a 7 mm mass in the upper-outer quadrant. This was biopsied under ultrasound guidance in [facility] 01/17/2014, and the pathology showed ([id number]) an invasive ductal carcinoma, E-cadherin positive, grade 1 or 2, estrogen receptor 99% positive, progesterone receptor 14% positive, with no amplification of HER-2, and with an MIB-1 of 17%.  On 02/19/2014 the patient underwent bilateral breast MRI. This showed a signal void in the superior left breast, but no abnormal enhancement, suggesting that most if not all of the original lesion was removed and biopsied.  The patient's subsequent history is as detailed below  INTERVAL HISTORY: Zoe Boone returns today for follow-up of her breast cancer. She has been on tamoxifen since January and is tolerates this well with few complaints. She has mild hot flashes and a small vaginal discharge. The interval history is remarkable for feeling changes to her left breast along the incision line. She also has shooting pains to the left breast that starts in the axilla and shoots towards the nipple, which are going to benign in nature.    REVIEW OF SYSTEMS: Zoe Boone denies fevers, chills, nausea, vomiting, or changes in bowel habits. She has about 4 UTIs every year and recently took a course of cipro for one. She no longer complains of burning with urination but is curious to see if the infection is resolving. Her PCP could send her to a urologist to "stretch" her urethra, but she is told that is very painful. She denies shortness of breath, chest pain, cough, or palpitations. She has occasional migraine headaches, but denies vision changes, or weakness. She is chronically fatigued. A detailed review of system is otherwise stable.  PAST MEDICAL HISTORY: Past Medical History  Diagnosis Date  . Anxiety   . Hypertension   . Cancer [phone]    left breast mammary carcinoma  . GERD (gastroesophageal reflux disease)   . Hyperlipidemia   . Thyroid disease   . PONV (postoperative nausea and vomiting)   . Arthritis     PAST SURGICAL HISTORY: Past Surgical History  Procedure Laterality Date  . Cataract extraction      bilat.   . Colonoscopy  2014  . Knee arthroscopy  2010    right   . Bladder suspension  1992  . Abdominal hysterectomy  1985  . Bunionectomy  1990    lt/rt  . Breast surgery      multiple br bx    FAMILY HISTORY Family History  Problem Relation Age of Onset  . Leukemia Father   . Diabetes Father   . Cancer Father     luekemia  . Skin cancer Mother   . Hypertension Mother   . Kidney cancer Brother   . Melanoma Brother    the patient's father died from complications of  chronic leukemia at the age of 69. He had been diagnosed at age 44. The patient's mother died from "old age" at age 22. The patient has 3 brothers and 2 sisters. One brother was diagnosed with kidney cancer at the age of 73. There is one maternal aunt (out of 2) with breast cancer diagnosed at age 50, , and one paternal aunt (43) diagnosed with breast cancer at age 15. There is no history of ovarian cancer in the family.  GYNECOLOGIC  HISTORY:  No LMP recorded. Patient has had a hysterectomy. Menarche age 42, first live birth age 32. The patient is GX P2. She underwent abdominal hysterectomy and subsequent bilateral salpingo-oophorectomy at age 36. She took hormone replacement for approximately 10 years.  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 Her husband Zoe Boone is retired from [employer]. It suggests that 2 of them at home, with no pets. Their daughter Zoe Boone is a Education officer, museum in [place], as is your second daughter, Zoe Boone. The patient has 4 grandchildren, including a granddaughter who is just entering medical school in [place], 2 grandsons at [employer], and a fourth is a Print production planner. The patient attends a local [name]     ADVANCED DIRECTIVES: Not in place   HEALTH MAINTENANCE: Social History  Substance Use Topics  . Smoking status: Never Smoker   . Smokeless tobacco: Not on file  . Alcohol Use: No     Colonoscopy: Up to date/Zoe Boone  PAP:  Bone density:  Lipid panel:  Allergies  Allergen Reactions  . Aspirin Nausea Only    Gi upset    Current Outpatient Prescriptions  Medication Sig Dispense Refill  . ALPRAZolam (XANAX) 0.25 MG tablet Take 0.25 mg by mouth at bedtime as needed for anxiety.    . benazepril (LOTENSIN) 20 MG tablet Take 20 mg by mouth daily.    . butalbital-acetaminophen-caffeine (FIORICET, ESGIC) 50-325-40 MG per tablet TAKE 1 OR 2 TABLETS EVERY 6 HOURS AS NEEDED FOR SEVERE HEADACHE  4  . Cholecalciferol 1000 UNITS capsule Take 2,000 Units by mouth daily.     Marland Kitchen levothyroxine (SYNTHROID, LEVOTHROID) 75 MCG tablet Take 75 mcg by mouth daily before breakfast.    . metoprolol tartrate (LOPRESSOR) 25 MG tablet Take 25 mg by mouth 2 (two) times daily.  3  . Multiple Vitamin (MULTIVITAMIN) tablet Take 1 tablet by mouth daily.    . pravastatin (PRAVACHOL) 40 MG tablet Take  40 mg by mouth daily.    . tamoxifen (NOLVADEX) 20 MG tablet Take 20 mg by mouth daily.    . temazepam (RESTORIL) 15 MG capsule Take 15 mg by mouth at bedtime as needed for sleep.    . ciprofloxacin (CIPRO) 500 MG tablet Take 1 tablet (500 mg total) by mouth 2 (two) times daily. 10 tablet 0  . nitroGLYCERIN (NITROSTAT) 0.4 MG SL tablet Place 0.4 mg under the tongue every 5 (five) minutes as needed for chest pain.    Marland Kitchen omeprazole (PRILOSEC) 20 MG capsule Take 20 mg by mouth daily as needed.     . valACYclovir (VALTREX) 500 MG tablet Take 500 mg by mouth as needed (for flare-ups).      No current facility-administered medications for this visit.    OBJECTIVE: Middle-aged white woman who appears stated age 7 Vitals:   04/30/15 1119  BP: 150/74  Pulse: 57  Temp: 98.6 F (37  C)  Resp: 18     Body mass index is 22.58 kg/(m^2).    ECOG FS:1 - Symptomatic but completely ambulatory  Skin: warm, dry  HEENT: sclerae anicteric, conjunctivae pink, oropharynx clear. No thrush or mucositis.  Lymph Nodes: No cervical or supraclavicular lymphadenopathy  Lungs: clear to auscultation bilaterally, no rales, wheezes, or rhonci  Heart: regular rate and rhythm  Abdomen: round, soft, non tender, positive bowel sounds  Musculoskeletal: No focal spinal tenderness, no peripheral edema  Neuro: non focal, well oriented, positive affect  Breast: left breast status post lumpectomy and radiation. No skin or nipple change. No evidence of recurrent diease. Left axilla benign. Right breast unremarkable.    LAB RESULTS:  CMP     Component Value Date/Time   NA 139 04/30/2015 1053   NA 140 03/19/2014 1100   K 5.1 04/30/2015 1053   K 4.5 03/19/2014 1100   CL 100 03/19/2014 1100   CO2 29 04/30/2015 1053   CO2 29 03/19/2014 1100   GLUCOSE 86 04/30/2015 1053   GLUCOSE 89 03/19/2014 1100   BUN 16.1 04/30/2015 1053   BUN 17 03/19/2014 1100   CREATININE 0.8 04/30/2015 1053   CREATININE 0.74 03/19/2014 1100    CALCIUM 9.8 04/30/2015 1053   CALCIUM 10.1 03/19/2014 1100   PROT 6.7 04/30/2015 1053   PROT 7.5 03/19/2014 1100   ALBUMIN 3.9 04/30/2015 1053   ALBUMIN 4.4 03/19/2014 1100   AST 19 04/30/2015 1053   AST 25 03/19/2014 1100   ALT 10 04/30/2015 1053   ALT 17 03/19/2014 1100   ALKPHOS 40 04/30/2015 1053   ALKPHOS 75 03/19/2014 1100   BILITOT <0.30 04/30/2015 1053   BILITOT 0.2* 03/19/2014 1100   GFRNONAA 84* 03/19/2014 1100   GFRAA >90 03/19/2014 1100    I No results found for: SPEP  Lab Results  Component Value Date   WBC 4.1 04/30/2015   NEUTROABS 2.3 04/30/2015   HGB 12.0 04/30/2015   HCT 36.6 04/30/2015   MCV 97.0 04/30/2015   PLT 244 04/30/2015      Chemistry      Component Value Date/Time   NA 139 04/30/2015 1053   NA 140 03/19/2014 1100   K 5.1 04/30/2015 1053   K 4.5 03/19/2014 1100   CL 100 03/19/2014 1100   CO2 29 04/30/2015 1053   CO2 29 03/19/2014 1100   BUN 16.1 04/30/2015 1053   BUN 17 03/19/2014 1100   CREATININE 0.8 04/30/2015 1053   CREATININE 0.74 03/19/2014 1100      Component Value Date/Time   CALCIUM 9.8 04/30/2015 1053   CALCIUM 10.1 03/19/2014 1100   ALKPHOS 40 04/30/2015 1053   ALKPHOS 75 03/19/2014 1100   AST 19 04/30/2015 1053   AST 25 03/19/2014 1100   ALT 10 04/30/2015 1053   ALT 17 03/19/2014 1100   BILITOT <0.30 04/30/2015 1053   BILITOT 0.2* 03/19/2014 1100       No results found for: LABCA2  No components found for: LABCA125  No results for input(s): INR in the last 168 hours.  Urinalysis No results found for: COLORURINE  STUDIES: No results found.  ASSESSMENT: 74 y.o. [place] woman s/p left upper-outer quadrant biopsy 01/16/2014 for a clinical T1b N0, stage IA invasive ductal breast cancer  (1) status post left lumpectomy and sentinel lymph node sampling 03/21/2014 for a pT1b pN0, stage IA invasive ductal carcinoma, repeat HER-2 again negative.  (2) adjuvant radiation completed 06/17/2014 in   (3)  started tamoxifen  07/05/2014  (a) status post remote TAH-BSO  PLAN: From palpation, it seems that the area of concern for Zoe Boone is going to be post lumpectomy changes and scar tissue. Her most recent mammogram in July showed no evidence of malignancy. We will follow this up with a left ultrasound to be sure.   The labs were reviewed in detail and were stable. She is tolerating the tamoxifen and will continue this drug at least 1 more year before considering switching to an aromatase inhibitor.   Her urine specimen shows trace levels of leukocyte esterase, so it seems she did have a UTI and the course of cipro she took previously is doing its job now that her burning has resolved.  Zoe Boone will return in March for follow up with Dr. Jana Hakim if her ultrasound returns benign.. She understands and agrees with this plan. She know the goal of treatment in her case is cure. She has been encouraged to call with any issues that might arise before her next visit here.  Laurie Panda, NP   04/30/2015 12:24 PM

## 2015-05-05 ENCOUNTER — Ambulatory Visit
Admission: RE | Admit: 2015-05-05 | Discharge: 2015-05-05 | Disposition: A | Discharge status: Home / Self Care | Payer: Medicare Other | Source: Ambulatory Visit | Attending: Nurse Practitioner | Admitting: Nurse Practitioner

## 2015-05-05 ENCOUNTER — Other Ambulatory Visit: Payer: Self-pay | Admitting: Nurse Practitioner

## 2015-05-05 DIAGNOSIS — N6012 Diffuse cystic mastopathy of left breast: Secondary | ICD-10-CM

## 2015-06-14 ENCOUNTER — Other Ambulatory Visit: Payer: Self-pay | Admitting: Oncology

## 2015-06-27 ENCOUNTER — Telehealth: Payer: Self-pay | Admitting: *Deleted

## 2015-06-27 DIAGNOSIS — C50412 Malignant neoplasm of upper-outer quadrant of left female breast: Secondary | ICD-10-CM

## 2015-06-27 MED ORDER — CEPHALEXIN 500 MG PO CAPS: 500.0000 mg | ORAL_CAPSULE | Freq: Two times a day (BID) | ORAL | Status: DC

## 2015-06-27 NOTE — Telephone Encounter (Signed)
FYI "I have a problem with my left breast.  I had surgery September 2015 and the incision is red.  Can Heather call in an antibiotic?  I noticed redness a few weeks ago but it keeps getting progressively worse.  It feels warm to touch.  No bumps, pimples or raised rash.  No itching or pain.  No opening, drainage it's just red and slightly warm.  I do not feel like I've had a fever and no shaking or chills.  In October I found a lump but [place] of breast revealed scar tissue."  Denies new laundry powder, medications, Does not use of Deoderant.  Uses Dove soap.  Asked her to check temperature and offered to come in now for [facility].  "I'm in the tub at this time and can't come in today.  I'll call back next week."  Advised we open 07-01-2015, to monitor her temperature and go to an Urgent care if area continues to change.

## 2015-06-27 NOTE — Telephone Encounter (Signed)
Writer called and spoke with patient.  Per Dr. Jana Hakim patient can have 5 days of Keflex 500 mg BID.  Patient encouraged to call next week to update especially if the area continues to stay the same or become worse.

## 2015-09-10 ENCOUNTER — Other Ambulatory Visit: Payer: Self-pay | Admitting: *Deleted

## 2015-09-10 DIAGNOSIS — C50412 Malignant neoplasm of upper-outer quadrant of left female breast: Secondary | ICD-10-CM

## 2015-09-11 ENCOUNTER — Other Ambulatory Visit: Payer: Self-pay

## 2015-09-11 ENCOUNTER — Other Ambulatory Visit (HOSPITAL_BASED_OUTPATIENT_CLINIC_OR_DEPARTMENT_OTHER): Payer: Medicare Other

## 2015-09-11 ENCOUNTER — Ambulatory Visit (HOSPITAL_COMMUNITY)
Admission: RE | Admit: 2015-09-11 | Discharge: 2015-09-11 | Disposition: A | Discharge status: Home / Self Care | Payer: Medicare Other | Source: Ambulatory Visit | Attending: Oncology | Admitting: Oncology

## 2015-09-11 ENCOUNTER — Ambulatory Visit (HOSPITAL_BASED_OUTPATIENT_CLINIC_OR_DEPARTMENT_OTHER): Payer: Medicare Other | Admitting: Oncology

## 2015-09-11 ENCOUNTER — Telehealth: Payer: Self-pay | Admitting: Oncology

## 2015-09-11 VITALS — BP 156/82 | HR 66 | Temp 98.3°F | Resp 18 | Ht 62.0 in | Wt 126.5 lb

## 2015-09-11 DIAGNOSIS — M5136 Other intervertebral disc degeneration, lumbar region: Secondary | ICD-10-CM | POA: Diagnosis not present

## 2015-09-11 DIAGNOSIS — R300 Dysuria: Secondary | ICD-10-CM | POA: Diagnosis not present

## 2015-09-11 DIAGNOSIS — Z79811 Long term (current) use of aromatase inhibitors: Secondary | ICD-10-CM | POA: Diagnosis not present

## 2015-09-11 DIAGNOSIS — C50412 Malignant neoplasm of upper-outer quadrant of left female breast: Secondary | ICD-10-CM

## 2015-09-11 DIAGNOSIS — Z853 Personal history of malignant neoplasm of breast: Secondary | ICD-10-CM

## 2015-09-11 DIAGNOSIS — M4316 Spondylolisthesis, lumbar region: Secondary | ICD-10-CM | POA: Insufficient documentation

## 2015-09-11 LAB — URINALYSIS, MICROSCOPIC - CHCC
BILIRUBIN (URINE): NEGATIVE
Blood: NEGATIVE
Glucose: NEGATIVE mg/dL
Ketones: NEGATIVE mg/dL
NITRITE: NEGATIVE
Protein: NEGATIVE mg/dL
RBC / HPF: NEGATIVE (ref 0–2)
Specific Gravity, Urine: 1.015 (ref 1.003–1.035)
UROBILINOGEN UR: 0.2 mg/dL (ref 0.2–1)
pH: 6 (ref 4.6–8.0)

## 2015-09-11 LAB — CBC WITH DIFFERENTIAL/PLATELET
BASO%: 0.8 % (ref 0.0–2.0)
Basophils Absolute: 0 10*3/uL (ref 0.0–0.1)
EOS ABS: 0.1 10*3/uL (ref 0.0–0.5)
EOS%: 2.1 % (ref 0.0–7.0)
HCT: 37.2 % (ref 34.8–46.6)
HGB: 12.2 g/dL (ref 11.6–15.9)
LYMPH%: 20.9 % (ref 14.0–49.7)
MCH: 31.6 pg (ref 25.1–34.0)
MCHC: 32.7 g/dL (ref 31.5–36.0)
MCV: 96.6 fL (ref 79.5–101.0)
MONO#: 0.3 10*3/uL (ref 0.1–0.9)
MONO%: 6.6 % (ref 0.0–14.0)
NEUT%: 69.6 % (ref 38.4–76.8)
NEUTROS ABS: 3.4 10*3/uL (ref 1.5–6.5)
Platelets: 239 10*3/uL (ref 145–400)
RBC: 3.85 10*6/uL (ref 3.70–5.45)
RDW: 13 % (ref 11.2–14.5)
WBC: 4.8 10*3/uL (ref 3.9–10.3)
lymph#: 1 10*3/uL (ref 0.9–3.3)

## 2015-09-11 LAB — COMPREHENSIVE METABOLIC PANEL
ALT: <9 U/L (ref 0–55)
AST: 21 U/L (ref 5–34)
Albumin: 4 g/dL (ref 3.5–5.0)
Alkaline Phosphatase: 48 U/L (ref 40–150)
Anion Gap: 8 mEq/L (ref 3–11)
BUN: 18.2 mg/dL (ref 7.0–26.0)
CHLORIDE: 104 meq/L (ref 98–109)
CO2: 28 meq/L (ref 22–29)
Calcium: 9.5 mg/dL (ref 8.4–10.4)
Creatinine: 0.9 mg/dL (ref 0.6–1.1)
EGFR: 64 mL/min/{1.73_m2} — AB (ref >90)
GLUCOSE: 90 mg/dL (ref 70–140)
POTASSIUM: 4.5 meq/L (ref 3.5–5.1)
SODIUM: 140 meq/L (ref 136–145)
TOTAL PROTEIN: 7 g/dL (ref 6.4–8.3)

## 2015-09-11 LAB — SEDIMENTATION RATE: SED RATE: 2 mm/h (ref 0–40)

## 2015-09-11 NOTE — Progress Notes (Signed)
[place]  Telephone:([phone] Fax:([phone]) [phone]     ID: Zoe Boone DOB: [birth year]  MR#: [id number]  DYN#:[id number]  Patient Care Team: Raina Mina, MD as PCP - General (Internal Medicine) Chauncey Cruel, MD as Consulting Physician (Oncology) Thea Silversmith, MD as Consulting Physician (Radiation Oncology) Richardo Priest, MD as Referring Physician (Cardiology) Boone Skates, MD as Consulting Physician (General Surgery)  OTHER MD: Gatha Mayer M.D., Everlene Farrier M.D.  CHIEF COMPLAINT: estrogen receptor positive breast cancer  CURRENT TREATMENT: tamoaxifen  BREAST CANCER HISTORY: From the original intake note:  "Zoe Boone" had routine screening mammography in [facility] showing a possible mass in the upper left breast. Additional views and left breast ultrasonography confirmed a 7 mm mass in the upper-outer quadrant. This was biopsied under ultrasound guidance in [facility] 01/17/2014, and the pathology showed ([id number]) an invasive ductal carcinoma, E-cadherin positive, grade 1 or 2, estrogen receptor 99% positive, progesterone receptor 14% positive, with no amplification of HER-2, and with an MIB-1 of 17%.  On 02/19/2014 the patient underwent bilateral breast MRI. This showed a signal void in the superior left breast, but no abnormal enhancement, suggesting that most if not all of the original lesion was removed and biopsied.  The patient's subsequent history is as detailed below  INTERVAL HISTORY: Zoe Boone returns today for follow-up of her breast cancer.  She continues on tamoxifen. She does not complain of hot flashes but does have some chills and night sweats. She obtains a drug at a good price.  REVIEW OF SYSTEMS: Zoe Boone  Says "I feel bad". She has no energy. She takes a shower and does her hair and that's all she can do for the rest of the day. She has low back pain. Yesterday morning when she got out of the bathroom she had a  knifelike pain in her vaginal area including also the medial upper left thigh. That has not recurred but it worries her and she wants me to do a pelvic exam today. She has pain in the left breast which is of course the surgical breast but she also has pain in the right breast. They used to be a lump in the right breast but she does not feel it anymore. She is worried because she read in her mammography report that she has fibroglandular tissue in her breast. She is not sleeping well. She bruises easily. She has pain in many joints including her lower back and sometimes she finds it difficult to walk. She has headaches. She denies depression or anxiety. She complained that the lites in the room felt very hot. She denies weight change, rash, bleeding, or fever. A detailed review of systems today was otherwise noncontributory  PAST MEDICAL HISTORY: Past Medical History  Diagnosis Date  . Anxiety   . Hypertension   . Cancer [facility]) [id number]    left breast mammary carcinoma  . GERD (gastroesophageal reflux disease)   . Hyperlipidemia   . Thyroid disease   . PONV (postoperative nausea and vomiting)   . Arthritis     PAST SURGICAL HISTORY: Past Surgical History  Procedure Laterality Date  . Cataract extraction      bilat.   . Colonoscopy  2014  . Knee arthroscopy  2010    right   . Bladder suspension  1992  . Abdominal hysterectomy  1985  . Bunionectomy  1990    lt/rt  . Breast surgery      multiple br bx  FAMILY HISTORY Family History  Problem Relation Age of Onset  . Leukemia Father   . Diabetes Father   . Cancer Father     luekemia  . Skin cancer Mother   . Hypertension Mother   . Kidney cancer Brother   . Melanoma Brother    the patient's father died from complications of chronic leukemia at the age of 71. He had been diagnosed at age 2. The patient's mother died from "old age" at age 67. The patient has 3 brothers and 2 sisters. One brother was diagnosed with kidney cancer at  the age of 58. There is one maternal aunt (out of 2) with breast cancer diagnosed at age 68, , and one paternal aunt (13) diagnosed with breast cancer at age 53. There is no history of ovarian cancer in the family.  GYNECOLOGIC HISTORY:  No LMP recorded. Patient has had a hysterectomy. Menarche age 66, first live birth age 76. The patient is GX P2. She underwent abdominal hysterectomy and subsequent bilateral salpingo-oophorectomy at age 77. She took hormone replacement for approximately 10 years.  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 Her husband Zoe Boone is retired from [employer]. It suggests that 2 of them at home, with no pets. Their daughter Zoe Boone is a Education officer, museum in [place], as is your second daughter, Zoe Boone. The patient has 4 grandchildren, including a granddaughter who is just entering medical school in [place], 2 grandsons at [employer], and a fourth is a Print production planner. The patient attends a local [name]     ADVANCED DIRECTIVES: Not in place   HEALTH MAINTENANCE: Social History  Substance Use Topics  . Smoking status: Never Smoker   . Smokeless tobacco: Not on file  . Alcohol Use: No     Colonoscopy: Up to date/[name]  PAP:  Bone density:  Lipid panel:  Allergies  Allergen Reactions  . Aspirin Nausea Only    Gi upset    Current Outpatient Prescriptions  Medication Sig Dispense Refill  . ALPRAZolam (XANAX) 0.25 MG tablet Take 0.25 mg by mouth at bedtime as needed for anxiety.    . benazepril (LOTENSIN) 20 MG tablet Take 20 mg by mouth daily.    . butalbital-acetaminophen-caffeine (FIORICET, ESGIC) 50-325-40 MG per tablet TAKE 1 OR 2 TABLETS EVERY 6 HOURS AS NEEDED FOR SEVERE HEADACHE  4  . cephALEXin (KEFLEX) 500 MG capsule Take 1 capsule (500 mg total) by mouth 2 (two) times daily. 10 capsule 0  . Cholecalciferol 1000 UNITS capsule Take 2,000  Units by mouth daily.     . ciprofloxacin (CIPRO) 500 MG tablet Take 1 tablet (500 mg total) by mouth 2 (two) times daily. 10 tablet 0  . levothyroxine (SYNTHROID, LEVOTHROID) 75 MCG tablet Take 75 mcg by mouth daily before breakfast.    . metoprolol tartrate (LOPRESSOR) 25 MG tablet Take 25 mg by mouth 2 (two) times daily.  3  . Multiple Vitamin (MULTIVITAMIN) tablet Take 1 tablet by mouth daily.    . nitroGLYCERIN (NITROSTAT) 0.4 MG SL tablet Place 0.4 mg under the tongue every 5 (five) minutes as needed for chest pain.    Marland Kitchen omeprazole (PRILOSEC) 20 MG capsule Take 20 mg by mouth daily as needed.     . pravastatin (PRAVACHOL) 40 MG tablet Take 40 mg by mouth daily.    . tamoxifen (NOLVADEX) 20 MG tablet TAKE 1  TABLET EVERY DAY 90 tablet 3  . temazepam (RESTORIL) 15 MG capsule Take 15 mg by mouth at bedtime as needed for sleep.    . valACYclovir (VALTREX) 500 MG tablet Take 500 mg by mouth as needed (for flare-ups).      No current facility-administered medications for this visit.    OBJECTIVE: Middle-aged white woman  Filed Vitals:   09/11/15 1109  BP: 156/82  Pulse: 66  Temp: 98.3 F (36.8 C)  Resp: 18     Body mass index is 23.13 kg/(m^2).    ECOG FS:2 - Symptomatic, <50% confined to bed  Sclerae unicteric, EOMs intact Oropharynx clear, dentition in good repair No cervical or supraclavicular adenopathy Lungs no rales or rhonchi Heart regular rate and rhythm Abd soft, nontender, positive bowel sounds, no masses palpated MSK no focal spinal tenderness, no upper extremity lymphedema Neuro: nonfocal, well oriented, anxious and depressed, slightly flat affect Breasts: the right breast shows no suspicious masses and no skin changes and no nipple changes. The right axilla is benign. The left breast has scar tissue associated with the lumpectomy scar. This was evaluated with an ultrasound after the last visit here. I am imaging at below for future reference. The left axilla is  benign   Left breast, 09/11/2015    LAB RESULTS:  CMP     Component Value Date/Time   NA 139 04/30/2015 1053   NA 140 03/19/2014 1100   K 5.1 04/30/2015 1053   K 4.5 03/19/2014 1100   CL 100 03/19/2014 1100   CO2 29 04/30/2015 1053   CO2 29 03/19/2014 1100   GLUCOSE 86 04/30/2015 1053   GLUCOSE 89 03/19/2014 1100   BUN 16.1 04/30/2015 1053   BUN 17 03/19/2014 1100   CREATININE 0.8 04/30/2015 1053   CREATININE 0.74 03/19/2014 1100   CALCIUM 9.8 04/30/2015 1053   CALCIUM 10.1 03/19/2014 1100   PROT 6.7 04/30/2015 1053   PROT 7.5 03/19/2014 1100   ALBUMIN 3.9 04/30/2015 1053   ALBUMIN 4.4 03/19/2014 1100   AST 19 04/30/2015 1053   AST 25 03/19/2014 1100   ALT 10 04/30/2015 1053   ALT 17 03/19/2014 1100   ALKPHOS 40 04/30/2015 1053   ALKPHOS 75 03/19/2014 1100   BILITOT <0.30 04/30/2015 1053   BILITOT 0.2* 03/19/2014 1100   GFRNONAA 84* 03/19/2014 1100   GFRAA >90 03/19/2014 1100    I No results found for: SPEP  Lab Results  Component Value Date   WBC 4.8 09/11/2015   NEUTROABS 3.4 09/11/2015   HGB 12.2 09/11/2015   HCT 37.2 09/11/2015   MCV 96.6 09/11/2015   PLT 239 09/11/2015      Chemistry      Component Value Date/Time   NA 139 04/30/2015 1053   NA 140 03/19/2014 1100   K 5.1 04/30/2015 1053   K 4.5 03/19/2014 1100   CL 100 03/19/2014 1100   CO2 29 04/30/2015 1053   CO2 29 03/19/2014 1100   BUN 16.1 04/30/2015 1053   BUN 17 03/19/2014 1100   CREATININE 0.8 04/30/2015 1053   CREATININE 0.74 03/19/2014 1100      Component Value Date/Time   CALCIUM 9.8 04/30/2015 1053   CALCIUM 10.1 03/19/2014 1100   ALKPHOS 40 04/30/2015 1053   ALKPHOS 75 03/19/2014 1100   AST 19 04/30/2015 1053   AST 25 03/19/2014 1100   ALT 10 04/30/2015 1053   ALT 17 03/19/2014 1100   BILITOT <0.30 04/30/2015 1053   BILITOT 0.2*  03/19/2014 1100       No results found for: LABCA2  No components found for: VVZSM270  No results for input(s): INR in the last 168  hours.  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 Annual evaluation. Patient states that she has a palpable thickening in the low left axilla which has been present for several months without change.  EXAM: DIGITAL DIAGNOSTIC BILATERAL MAMMOGRAM WITH 3D TOMOSYNTHESIS WITH CAD  ULTRASOUND LEFT AXILLA  COMPARISON: Mammography 08/23/2014 (left), 01/14/2014 (bilateral) and earlier. Left axillary ultrasound 01/14/2014.  ACR Breast Density Category b: There are scattered areas of fibroglandular density.  FINDINGS: CC and MLO views of both breasts with tomosynthesis and a spot magnification tangential view of the lumpectomy site in the upper left breast were obtained. Scarring associated with a developing oil cyst at the lumpectomy site in the upper left breast. No findings suspicious for malignancy in the left breast.  No findings suspicious for malignancy in the right breast.  Mammographic images were processed with CAD.  On physical exam, there is palpable thickening in the left lateral chest wall beneath the left axilla in the area of patient concern, but I do not palpate a discrete mass or lymphadenopathy.  Targeted ultrasound of the lower left axilla is performed, showing normal subcutaneous fat and normal appearing serratus and latissimus muscles in the area of palpable concern. No suspicious solid mass or pathologic lymphadenopathy is identified.  IMPRESSION: 1. No specific mammographic evidence of malignancy involving either breast. 2. Normal-appearing tissues in the area of palpable thickening in the low left axilla. 3. Expected post lumpectomy changes in the upper left breast.  RECOMMENDATION: Bilateral diagnostic mammography in 1 year.  I have  discussed the findings and recommendations with the patient. Results were also provided in writing at the conclusion of the visit. If applicable, a reminder letter will be sent to the patient regarding the next appointment.  BI-RADS CATEGORY 2: Benign.   Electronically Signed  By: Evangeline Dakin M.D.  On: 01/20/2015 11:54   ASSESSMENT: 75 y.o. [place] woman s/p left upper-outer quadrant biopsy 01/16/2014 for a clinical T1b N0, stage IA invasive ductal breast cancer  (1) status post left lumpectomy and sentinel lymph node sampling 03/21/2014 for a pT1b pN0, stage IA invasive ductal carcinoma, repeat HER-2 again negative.  (2) adjuvant radiation completed 06/17/2014 in [place]  (3) started tamoxifen 07/05/2014  (a) status post remote TAH-BSO  PLAN: Slyvia is now a year and a half out from her definitive surgery. There is no evidence of disease recurrence. She continues on tamoxifen and the plan is to continue that drug for minimum of 5 years  She wanted me to do a pelvic exam today for her unusual vaginal pain. I got her an appointment at Dr. Sherlynn Stalls office for later today, but she tells me she cannot go there today and that she will call and reschedule that appointment.  I did set her up for plain films of her lumbar spine today in case she is having a compression fracture in that area. However exam of that area was not painful.  Her pains are very diffuse and I don't have a good explanation for her weakness. Her lab work today so far is entirely normal. I have added an ESR and if that is elevated I would refer her to rheumatology.  Otherwise  I think Roya is extremely anxious about her diagnosis of breast cancer, and depressed. I think she would benefit from venlafaxine. I suggested she discuss an antidepressant with her primary care physician at their upcoming visit.  She will see [place] again in 3 months from now. She knows to call for any problems that may develop before  then.   Chauncey Cruel, MD   09/11/2015 11:15 AM

## 2015-09-11 NOTE — Telephone Encounter (Signed)
Patient sent to [facility] radiology for xray and given avs report and appointments for June.

## 2015-09-12 LAB — HEMOGLOBIN A1C
Est. average glucose Bld gHb Est-mCnc: 105 mg/dL
HEMOGLOBIN A1C: 5.3 % (ref 4.8–5.6)

## 2015-09-14 ENCOUNTER — Other Ambulatory Visit: Payer: Self-pay | Admitting: Oncology

## 2015-09-14 LAB — URINE CULTURE

## 2015-09-14 MED ORDER — NITROFURANTOIN MONOHYD MACRO 100 MG PO CAPS: 100.0000 mg | ORAL_CAPSULE | Freq: Two times a day (BID) | ORAL | Status: DC

## 2015-09-14 NOTE — Progress Notes (Signed)
Called pt w [facility] rezsults-- discussed macrobid-- called script-- she willcall back if symptoms don;t clear

## 2015-09-14 NOTE — Addendum Note (Signed)
Addended by: Chauncey Cruel on: 09/14/2015 05:13 PM   Modules accepted: Orders

## 2015-10-31 ENCOUNTER — Telehealth: Payer: Self-pay | Admitting: Nurse Practitioner

## 2015-10-31 NOTE — Telephone Encounter (Signed)
Pt called complaining of pains in her breast area where she had surgery when she takes a breath. Sch pt with HB for evaluation 5/8 at 1045 am

## 2015-11-07 ENCOUNTER — Ambulatory Visit: Payer: Medicare Other | Admitting: Nurse Practitioner

## 2015-11-10 ENCOUNTER — Telehealth: Payer: Self-pay | Admitting: Nurse Practitioner

## 2015-11-10 ENCOUNTER — Telehealth: Payer: Self-pay | Admitting: *Deleted

## 2015-11-10 ENCOUNTER — Ambulatory Visit (HOSPITAL_BASED_OUTPATIENT_CLINIC_OR_DEPARTMENT_OTHER): Payer: Medicare Other | Admitting: Nurse Practitioner

## 2015-11-10 ENCOUNTER — Encounter: Payer: Self-pay | Admitting: Nurse Practitioner

## 2015-11-10 ENCOUNTER — Ambulatory Visit (HOSPITAL_COMMUNITY)
Admission: RE | Admit: 2015-11-10 | Discharge: 2015-11-10 | Disposition: A | Discharge status: Home / Self Care | Payer: Medicare Other | Source: Ambulatory Visit | Attending: Nurse Practitioner | Admitting: Nurse Practitioner

## 2015-11-10 VITALS — BP 138/77 | HR 59 | Temp 98.2°F | Resp 18 | Ht 62.0 in | Wt 126.6 lb

## 2015-11-10 DIAGNOSIS — N6320 Unspecified lump in the left breast, unspecified quadrant: Secondary | ICD-10-CM

## 2015-11-10 DIAGNOSIS — R52 Pain, unspecified: Secondary | ICD-10-CM

## 2015-11-10 DIAGNOSIS — R091 Pleurisy: Secondary | ICD-10-CM | POA: Insufficient documentation

## 2015-11-10 DIAGNOSIS — C50412 Malignant neoplasm of upper-outer quadrant of left female breast: Secondary | ICD-10-CM

## 2015-11-10 DIAGNOSIS — N390 Urinary tract infection, site not specified: Secondary | ICD-10-CM

## 2015-11-10 MED ORDER — NITROFURANTOIN MONOHYD MACRO 100 MG PO CAPS: 100.0000 mg | ORAL_CAPSULE | Freq: Two times a day (BID) | ORAL | Status: DC

## 2015-11-10 MED ORDER — TRAMADOL HCL 50 MG PO TABS: 50.0000 mg | ORAL_TABLET | Freq: Four times a day (QID) | ORAL | Status: DC | PRN

## 2015-11-10 NOTE — Progress Notes (Signed)
Zoe Boone  Telephone:([phone]) [phone] Fax:(336) [phone]     ID: [name] DOB: 01-04-1941  MR#: [id number]  OXB#:[id number]  Patient Care Team: Raina Mina, MD as PCP - General (Internal Medicine) Chauncey Cruel, MD as Consulting Physician (Oncology) Thea Silversmith, MD as Consulting Physician (Radiation Oncology) Richardo Priest, MD as Referring Physician (Cardiology) Boone Skates, MD as Consulting Physician (General Surgery)  OTHER MD: Gatha Mayer M.D., Everlene Farrier M.D.  CHIEF COMPLAINT: estrogen receptor positive breast cancer  CURRENT TREATMENT: tamoaxifen  BREAST CANCER HISTORY: From the original intake note:  "Zoe Boone" had routine screening mammography in [facility] showing a possible mass in the upper left breast. Additional views and left breast ultrasonography confirmed a 7 mm mass in the upper-outer quadrant. This was biopsied under ultrasound guidance in [facility] 01/17/2014, and the pathology showed ([id number]) an invasive ductal carcinoma, E-cadherin positive, grade 1 or 2, estrogen receptor 99% positive, progesterone receptor 14% positive, with no amplification of HER-2, and with an MIB-1 of 17%.  On 02/19/2014 the patient underwent bilateral breast MRI. This showed a signal void in the superior left breast, but no abnormal enhancement, suggesting that most if not all of the original lesion was removed and biopsied.  The patient's subsequent history is as detailed below  INTERVAL HISTORY: Zoe Boone returns today for follow-up of her breast cancer. She was originally scheduled for a visit next month, but is here early with complaints of pain to her left breast when taking deep breaths. The breast is still erythematous along the incision site, but this is relatively unchanged. She has been on tamoxifen since January 2016. She tolerates this well with some occasional night sweats. She denies vaginal changes. The interval history is  generally unremarkable. She has some labs from a PCP visit that she says has abnormal results, and she would like me to take a look at them.   REVIEW OF SYSTEMS: Zoe Boone denies fevers, chills, nausea, vomiting, or changes in bowel or bladder habits. She is chronically fatigued and weak. She has low back pain and in a few other joints. She has difficulty staying steady when walking. She has headaches and occasional dizziness. She is depressed and anxious. Her appetite is fair but she is maintaining her weight. She does not sleep well without xanax or restoril. A detailed review of systems is otherwise stable.  PAST MEDICAL HISTORY: Past Medical History  Diagnosis Date  . Anxiety   . Hypertension   . Cancer [facility]) [id number]    left breast mammary carcinoma  . GERD (gastroesophageal reflux disease)   . Hyperlipidemia   . Thyroid disease   . PONV (postoperative nausea and vomiting)   . Arthritis     PAST SURGICAL HISTORY: Past Surgical History  Procedure Laterality Date  . Cataract extraction      bilat.   . Colonoscopy  2014  . Knee arthroscopy  2010    right   . Bladder suspension  1992  . Abdominal hysterectomy  1985  . Bunionectomy  1990    lt/rt  . Breast surgery      multiple br bx    FAMILY HISTORY Family History  Problem Relation Age of Onset  . Leukemia Father   . Diabetes Father   . Cancer Father     luekemia  . Skin cancer Mother   . Hypertension Mother   . Kidney cancer Brother   . Melanoma Brother    the patient's father died from  complications of chronic leukemia at the age of 97. He had been diagnosed at age 48. The patient's mother died from "old age" at age 24. The patient has 3 brothers and 2 sisters. One brother was diagnosed with kidney cancer at the age of 30. There is one maternal aunt (out of 2) with breast cancer diagnosed at age 49, , and one paternal aunt (74) diagnosed with breast cancer at age 64. There is no history of ovarian cancer in the  family.  GYNECOLOGIC HISTORY:  No LMP recorded. Patient has had a hysterectomy. Menarche age 29, first live birth age 34. The patient is GX P2. She underwent abdominal hysterectomy and subsequent bilateral salpingo-oophorectomy at age 13. She took hormone replacement for approximately 10 years.  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 Her husband Zoe Boone is retired from [employer]. It suggests that 2 of them at home, with no pets. Their daughter Zoe Boone is a Education officer, museum in [place], as is your second daughter, Zoe Boone. The patient has 4 grandchildren, including a granddaughter who is just entering medical school in [place], 2 grandsons at [employer], and a fourth is a Print production planner. The patient attends a local [name]     ADVANCED DIRECTIVES: Not in place   HEALTH MAINTENANCE: Social History  Substance Use Topics  . Smoking status: Never Smoker   . Smokeless tobacco: Not on file  . Alcohol Use: No     Colonoscopy: Up to date/Zoe Boone  PAP:  Bone density:  Lipid panel:  Allergies  Allergen Reactions  . Aspirin Nausea Only    Gi upset    Current Outpatient Prescriptions  Medication Sig Dispense Refill  . ALPRAZolam (XANAX) 0.25 MG tablet Take 0.25 mg by mouth at bedtime as needed for anxiety.    . benazepril (LOTENSIN) 20 MG tablet Take 20 mg by mouth daily.    . butalbital-acetaminophen-caffeine (FIORICET, ESGIC) 50-325-40 MG per tablet TAKE 1 OR 2 TABLETS EVERY 6 HOURS AS NEEDED FOR SEVERE HEADACHE  4  . Cholecalciferol 1000 UNITS capsule Take 2,000 Units by mouth daily.     Marland Kitchen levothyroxine (SYNTHROID, LEVOTHROID) 75 MCG tablet Take 75 mcg by mouth daily before breakfast.    . metoprolol tartrate (LOPRESSOR) 25 MG tablet Take 25 mg by mouth 2 (two) times daily.  3  . Multiple Vitamin (MULTIVITAMIN) tablet Take 1 tablet by mouth daily.    Marland Kitchen omeprazole  (PRILOSEC) 20 MG capsule Take 20 mg by mouth daily as needed.     . pravastatin (PRAVACHOL) 40 MG tablet Take 40 mg by mouth daily.    . tamoxifen (NOLVADEX) 20 MG tablet TAKE 1 TABLET EVERY DAY 90 tablet 3  . temazepam (RESTORIL) 15 MG capsule Take 15 mg by mouth at bedtime as needed for sleep.    . nitrofurantoin, macrocrystal-monohydrate, (MACROBID) 100 MG capsule Take 1 capsule (100 mg total) by mouth 2 (two) times daily. 10 capsule 0  . nitroGLYCERIN (NITROSTAT) 0.4 MG SL tablet Place 0.4 mg under the tongue every 5 (five) minutes as needed for chest pain. Reported on 11/10/2015    . traMADol (ULTRAM) 50 MG tablet Take 1 tablet (50 mg total) by mouth every 6 (six) hours as needed. 20 tablet 0  . valACYclovir (VALTREX) 500 MG tablet Take 500 mg by mouth as needed (for flare-ups). Reported on 11/10/2015     No current facility-administered  medications for this visit.    OBJECTIVE: Middle-aged white woman  Filed Vitals:   11/10/15 1008  BP: 138/77  Pulse: 59  Temp: 98.2 F (36.8 C)  Resp: 18     Body mass index is 23.15 kg/(m^2).    ECOG FS:2 - Symptomatic, <50% confined to bed  Skin: warm, dry  HEENT: sclerae anicteric, conjunctivae pink, oropharynx clear. No thrush or mucositis.  Lymph Nodes: No cervical or supraclavicular lymphadenopathy  Lungs: clear to auscultation bilaterally, no rales, wheezes, or rhonci  Heart: regular rate and rhythm  Abdomen: round, soft, non tender, positive bowel sounds  Musculoskeletal: No focal spinal tenderness, no peripheral edema  Neuro: non focal, well oriented, anxious affect  Breasts: left breast status post lumpectomy and radiation. Palpable scar tissue along incision line. Mild erythema easily blanchable. Left axilla benign. Right breast unremarkable.   Left breast, 09/11/2015    LAB RESULTS:  CMP     Component Value Date/Time   NA 140 09/11/2015 1035   NA 140 03/19/2014 1100   K 4.5 09/11/2015 1035   K 4.5 03/19/2014 1100   CL 100  03/19/2014 1100   CO2 28 09/11/2015 1035   CO2 29 03/19/2014 1100   GLUCOSE 90 09/11/2015 1035   GLUCOSE 89 03/19/2014 1100   BUN 18.2 09/11/2015 1035   BUN 17 03/19/2014 1100   CREATININE 0.9 09/11/2015 1035   CREATININE 0.74 03/19/2014 1100   CALCIUM 9.5 09/11/2015 1035   CALCIUM 10.1 03/19/2014 1100   PROT 7.0 09/11/2015 1035   PROT 7.5 03/19/2014 1100   ALBUMIN 4.0 09/11/2015 1035   ALBUMIN 4.4 03/19/2014 1100   AST 21 09/11/2015 1035   AST 25 03/19/2014 1100   ALT <9 09/11/2015 1035   ALT 17 03/19/2014 1100   ALKPHOS 48 09/11/2015 1035   ALKPHOS 75 03/19/2014 1100   BILITOT <0.30 09/11/2015 1035   BILITOT 0.2* 03/19/2014 1100   GFRNONAA 84* 03/19/2014 1100   GFRAA >90 03/19/2014 1100    I No results found for: SPEP  Lab Results  Component Value Date   WBC 4.8 09/11/2015   NEUTROABS 3.4 09/11/2015   HGB 12.2 09/11/2015   HCT 37.2 09/11/2015   MCV 96.6 09/11/2015   PLT 239 09/11/2015      Chemistry      Component Value Date/Time   NA 140 09/11/2015 1035   NA 140 03/19/2014 1100   K 4.5 09/11/2015 1035   K 4.5 03/19/2014 1100   CL 100 03/19/2014 1100   CO2 28 09/11/2015 1035   CO2 29 03/19/2014 1100   BUN 18.2 09/11/2015 1035   BUN 17 03/19/2014 1100   CREATININE 0.9 09/11/2015 1035   CREATININE 0.74 03/19/2014 1100      Component Value Date/Time   CALCIUM 9.5 09/11/2015 1035   CALCIUM 10.1 03/19/2014 1100   ALKPHOS 48 09/11/2015 1035   ALKPHOS 75 03/19/2014 1100   AST 21 09/11/2015 1035   AST 25 03/19/2014 1100   ALT <9 09/11/2015 1035   ALT 17 03/19/2014 1100   BILITOT <0.30 09/11/2015 1035   BILITOT 0.2* 03/19/2014 1100       No results found for: LABCA2  No components found for: LABCA125  No results for input(s): INR in the last 168 hours.  Urinalysis No results found for: COLORURINE  STUDIES:  No results found.  ASSESSMENT: 75 y.o. [place] woman s/p left upper-outer quadrant biopsy 01/16/2014 for a clinical T1b N0, stage IA  invasive ductal breast cancer  (  1) status post left lumpectomy and sentinel lymph node sampling 03/21/2014 for a pT1b pN0, stage IA invasive ductal carcinoma, repeat HER-2 again negative.  (2) adjuvant radiation completed 06/17/2014 in [place]  (3) started tamoxifen 07/05/2014  (a) status post remote TAH-BSO  PLAN: Taelor is very anxious about her left lumpectomy scar. I do not palpate any changes, but she insists on a repeat ultrasound at [facility] with Dr. Purcell Nails, the radiologist who found her initial cancer. I am happy to oblige with this request. Her pain during inspiration is also in the breast area, but we are going to get a chest xray to confirm that her lungs are also normal.   I reviewed the set of labs from her PCP. The hgb of 11.8 is just borderline as well as the WBC of 4.4. Under our reference ranges these values are actually normal. We will continue to monitor.   I have given her a small quantity of tramadol for pain. I have also given her a prescription for 10 tablets of macrobid, as she has a few UTIs per year and spends a lot of money having to go to the ED for evaluation.   Bob will return in 6 months for follow up with Dr. Jana Hakim. She understands and agrees with this plan. She knows the goal of treatment in her case is cure. She has been encouraged to call with any issues that might arise before her next visit here.   Laurie Panda, NP   11/10/2015 10:54 AM

## 2015-11-10 NOTE — Telephone Encounter (Signed)
Called Zoe Boone to inform her that the results of the Chest X-Ray was normal and showed no abnormalities. Zoe Boone knows she will see Dr. Jana Hakim in November. Message to be fwd to [employer].

## 2015-11-10 NOTE — Telephone Encounter (Signed)
appt made and avs printed. Mammo/us sched for [facility] May 15 at 150 pm

## 2015-11-25 ENCOUNTER — Other Ambulatory Visit: Payer: Medicare Other

## 2015-11-28 ENCOUNTER — Ambulatory Visit
Admission: RE | Admit: 2015-11-28 | Discharge: 2015-11-28 | Disposition: A | Discharge status: Home / Self Care | Payer: Medicare Other | Source: Ambulatory Visit | Attending: Nurse Practitioner | Admitting: Nurse Practitioner

## 2015-11-28 DIAGNOSIS — N6320 Unspecified lump in the left breast, unspecified quadrant: Secondary | ICD-10-CM

## 2015-12-11 ENCOUNTER — Other Ambulatory Visit: Payer: Medicare Other

## 2015-12-11 ENCOUNTER — Ambulatory Visit: Payer: Medicare Other | Admitting: Nurse Practitioner

## 2016-01-05 ENCOUNTER — Telehealth: Payer: Self-pay | Admitting: *Deleted

## 2016-01-05 ENCOUNTER — Telehealth: Payer: Self-pay | Admitting: Oncology

## 2016-01-05 NOTE — Telephone Encounter (Signed)
Spoke with patient re follow 7/13

## 2016-01-05 NOTE — Telephone Encounter (Signed)
This RN received call from pt stating she has been diagnosed " with 2 blood clots in my left leg last week ".  Zoe Boone has stopped the tamoxifen ( on since 2016 ) and is currently on eloquis 5 mg bid.  She states area on breast MD is watching is " still there and getting larger ".  Per above appointment will be made for MD this month.

## 2016-01-08 ENCOUNTER — Telehealth: Payer: Self-pay | Admitting: *Deleted

## 2016-01-08 NOTE — Telephone Encounter (Signed)
This RN spoke with pt per VM stating pain with leg with new blood clots - " people are telling me I could go to the ER and they could put some medicine in my veins to break the clots up"  This RN discussed with above is not a known therapy for clots in the leg- above has been used for clots related to stroke patients - inquired about current pain medication she has available post new diagnosis of blood clots and how she is using it. Vyola states she has tramadol " but I just suffer through the pain until later in the evening then I take a tramadol."  Per inquiry she denies any swelling in legs.  Pain is throbbing and increases as she is up on her legs more.   This RN discussed need to use the tramadol earlier in the day to stay ahead of the pain . This RN informed if she feels the tramadol makes her sleepy she can use tylenol in place of the tramadol- [place] states she does not have tylenol in the home.  Plan per call is Mysti will obtain tylenol and use during the day- and then if pain not well controlled will use the tramadol.  This RN will communicate with pt in am 7/7 for update.

## 2016-01-15 ENCOUNTER — Ambulatory Visit (HOSPITAL_BASED_OUTPATIENT_CLINIC_OR_DEPARTMENT_OTHER): Payer: Medicare Other | Admitting: Oncology

## 2016-01-15 VITALS — BP 150/74 | HR 76 | Temp 98.3°F | Resp 18 | Ht 62.0 in | Wt 124.8 lb

## 2016-01-15 DIAGNOSIS — Z86718 Personal history of other venous thrombosis and embolism: Secondary | ICD-10-CM | POA: Diagnosis not present

## 2016-01-15 DIAGNOSIS — F329 Major depressive disorder, single episode, unspecified: Secondary | ICD-10-CM | POA: Diagnosis not present

## 2016-01-15 DIAGNOSIS — C50412 Malignant neoplasm of upper-outer quadrant of left female breast: Secondary | ICD-10-CM

## 2016-01-15 MED ORDER — CITALOPRAM HYDROBROMIDE 10 MG PO TABS: 10.0000 mg | ORAL_TABLET | Freq: Every day | ORAL | Status: DC

## 2016-01-15 MED ORDER — TRAMADOL HCL 50 MG PO TABS: 50.0000 mg | ORAL_TABLET | Freq: Four times a day (QID) | ORAL | Status: DC | PRN

## 2016-01-15 NOTE — Progress Notes (Signed)
[place]  [phone] [phone]) [phone]     ID: SHANTANA CHRISTON DOB: 1940/10/23  MR#: [id number]  BWI#:[id number]  Patient Care Team: Raina Mina, MD as PCP - General (Internal Medicine) Chauncey Cruel, MD as Consulting Physician (Oncology) Thea Silversmith, MD as Consulting Physician (Radiation Oncology) Richardo Priest, MD as Referring Physician (Cardiology) Fanny Skates, MD as Consulting Physician (General Surgery)  OTHER MD: Gatha Mayer M.D., Everlene Farrier M.D.  CHIEF COMPLAINT: estrogen receptor positive breast cancer  CURRENT TREATMENT: tamoaxifen  BREAST CANCER HISTORY: From the original intake note:  "Zoe Boone" had routine screening mammography in [facility] showing a possible mass in the upper left breast. Additional views and left breast ultrasonography confirmed a 7 mm mass in the upper-outer quadrant. This was biopsied under ultrasound guidance in [facility] 01/17/2014, and the pathology showed ([id number]) an invasive ductal carcinoma, E-cadherin positive, grade 1 or 2, estrogen receptor 99% positive, progesterone receptor 14% positive, with no amplification of HER-2, and with an MIB-1 of 17%.  On 02/19/2014 the patient underwent bilateral breast MRI. This showed a signal void in the superior left breast, but no abnormal enhancement, suggesting that most if not all of the original lesion was removed and biopsied.  The patient's subsequent history is as detailed below  INTERVAL HISTORY: Zoe Boone returns today for follow-up of her estrogen receptor positive breast cancer accompanied by her daughters Levada Dy and Amy. Zoe Boone was on tamoxifen, but she developed pain in the left lower leg, was evaluated with Doppler ultrasonography 12/25/2015, and was found to have occlusive thrombus of the left peroneal and left posterior tibial veins. She was started on an apixaban. She is tolerating that well and so far has not had any bleeding  complications.  She called [place] to let us know I was going on and we stopped her tamoxifen at that time. She is here to discuss alternatives.  REVIEW OF SYSTEMS: Zoe Boone is in "terrific pain". "If you gave me morphine I would take it". The daughters tell me the patient has not left the house in the 3 weeks since she had the clot diagnosis. She hurts in her leg but also in her left hip, back, and neck. The pain is constant and stabbing. She feels mildly fatigued. She sleeps very poorly. She takes Restoril and alprazolam to help her sleep. She has some tramadol and she usually takes that at night as well. She had a recent urinary tract infection. This was treated with Macrobid. She feels anxious but denies depression. She wonders if she has cancer in the left breast, if she has cancer in her bones explaining her bone pain, if she has leukemia the way her father did, if she should be seen by cardiologist or a vein specialist. A detailed review of systems was otherwise stable  PAST MEDICAL HISTORY: Past Medical History  Diagnosis Date  . Anxiety   . Hypertension   . Cancer [facility]) [id number]    left breast mammary carcinoma  . GERD (gastroesophageal reflux disease)   . Hyperlipidemia   . Thyroid disease   . PONV (postoperative nausea and vomiting)   . Arthritis     PAST SURGICAL HISTORY: Past Surgical History  Procedure Laterality Date  . Cataract extraction      bilat.   . Colonoscopy  2014  . Knee arthroscopy  2010    right   . Bladder suspension  1992  . Abdominal hysterectomy  1985  . Bunionectomy  1990  lt/rt  . Breast surgery      multiple br bx    FAMILY HISTORY Family History  Problem Relation Age of Onset  . Leukemia Father   . Diabetes Father   . Cancer Father     luekemia  . Skin cancer Mother   . Hypertension Mother   . Kidney cancer Brother   . Melanoma Brother    the patient's father died from complications of chronic leukemia at the age of 4. He had been diagnosed  at age 16. The patient's mother died from "old age" at age 40. The patient has 3 brothers and 2 sisters. One brother was diagnosed with kidney cancer at the age of 25. There is one maternal aunt (out of 2) with breast cancer diagnosed at age 77, , and one paternal aunt (62) diagnosed with breast cancer at age 67. There is no history of ovarian cancer in the family.  GYNECOLOGIC HISTORY:  No LMP recorded. Patient has had a hysterectomy. Menarche age 35, first live birth age 74. The patient is GX P2. She underwent abdominal hysterectomy and subsequent bilateral salpingo-oophorectomy at age 68. She took hormone replacement for approximately 10 years.  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 Her husband [name] "Marguerite Olea" Grosshans is retired from [employer]. It suggests that 2 of them at home, with no pets. Their daughter [name] is a Education officer, museum in [place], as is your second daughter, [name]. The patient has 4 grandchildren, including a granddaughter who is just entering medical school in [place], 2 grandsons at [employer], and a fourth is a Print production planner. The patient attends a local [name]     ADVANCED DIRECTIVES: Not in place   HEALTH MAINTENANCE: Social History  Substance Use Topics  . Smoking status: Never Smoker   . Smokeless tobacco: Not on file  . Alcohol Use: No     Colonoscopy: Up to date/[name]  PAP:  Bone density:  Lipid panel:  Allergies  Allergen Reactions  . Aspirin Nausea Only    Gi upset    Current Outpatient Prescriptions  Medication Sig Dispense Refill  . ALPRAZolam (XANAX) 0.25 MG tablet Take 0.25 mg by mouth at bedtime as needed for anxiety.    . benazepril (LOTENSIN) 20 MG tablet Take 20 mg by mouth daily.    . butalbital-acetaminophen-caffeine (FIORICET, ESGIC) 50-325-40 MG per tablet TAKE 1 OR 2 TABLETS EVERY 6 HOURS AS NEEDED FOR SEVERE HEADACHE  4  .  Cholecalciferol 1000 UNITS capsule Take 2,000 Units by mouth daily.     . citalopram (CELEXA) 10 MG tablet Take 1 tablet (10 mg total) by mouth daily. 30 tablet 3  . levothyroxine (SYNTHROID, LEVOTHROID) 75 MCG tablet Take 75 mcg by mouth daily before breakfast.    . metoprolol tartrate (LOPRESSOR) 25 MG tablet Take 25 mg by mouth 2 (two) times daily.  3  . Multiple Vitamin (MULTIVITAMIN) tablet Take 1 tablet by mouth daily.    . nitroGLYCERIN (NITROSTAT) 0.4 MG SL tablet Place 0.4 mg under the tongue every 5 (five) minutes as needed for chest pain. Reported on 11/10/2015    . omeprazole (PRILOSEC) 20 MG capsule Take 20 mg by mouth daily as needed.     . pravastatin (PRAVACHOL) 40 MG tablet Take 40 mg by mouth daily.    . temazepam (RESTORIL) 15 MG capsule Take 15 mg by mouth at bedtime as  needed for sleep.    . traMADol (ULTRAM) 50 MG tablet Take 1-2 tablets (50-100 mg total) by mouth every 6 (six) hours as needed. 120 tablet 0   No current facility-administered medications for this visit.    OBJECTIVE: Middle-aged white woman Who appears stated age 75 Vitals:   01/15/16 1536  BP: 150/74  Pulse: 76  Temp: 98.3 F (36.8 C)  Resp: 18     Body mass index is 22.82 kg/(m^2).    ECOG FS:2 - Symptomatic, <50% confined to bed  Sclerae unicteric, EOMs intact Oropharynx clear and moist No cervical or supraclavicular adenopathy Lungs no rales or rhonchi Heart regular rate and rhythm Abd soft, nontender, positive bowel sounds MSK no focal spinal tenderness, minimal left ankle edema, no calf tenderness Neuro: nonfocal, well oriented, depressive affect Breasts: The right breast is unremarkable. The left breast is status post lumpectomy and radiation. There continues to be an area of erythema lateral to the left nipple. This is imaged below. The left axilla is benign.    Left breast, 09/11/2015    LAB RESULTS:  This is Dr. Jana Hakim  CMP     Component Value Date/Time   NA 140  09/11/2015 1035   NA 140 03/19/2014 1100   K 4.5 09/11/2015 1035   K 4.5 03/19/2014 1100   CL 100 03/19/2014 1100   CO2 28 09/11/2015 1035   CO2 29 03/19/2014 1100   GLUCOSE 90 09/11/2015 1035   GLUCOSE 89 03/19/2014 1100   BUN 18.2 09/11/2015 1035   BUN 17 03/19/2014 1100   CREATININE 0.9 09/11/2015 1035   CREATININE 0.74 03/19/2014 1100   CALCIUM 9.5 09/11/2015 1035   CALCIUM 10.1 03/19/2014 1100   PROT 7.0 09/11/2015 1035   PROT 7.5 03/19/2014 1100   ALBUMIN 4.0 09/11/2015 1035   ALBUMIN 4.4 03/19/2014 1100   AST 21 09/11/2015 1035   AST 25 03/19/2014 1100   ALT <9 09/11/2015 1035   ALT 17 03/19/2014 1100   ALKPHOS 48 09/11/2015 1035   ALKPHOS 75 03/19/2014 1100   BILITOT <0.30 09/11/2015 1035   BILITOT 0.2* 03/19/2014 1100   GFRNONAA 84* 03/19/2014 1100   GFRAA >90 03/19/2014 1100    I No results found for: SPEP  Lab Results  Component Value Date   WBC 4.8 09/11/2015   NEUTROABS 3.4 09/11/2015   HGB 12.2 09/11/2015   HCT 37.2 09/11/2015   MCV 96.6 09/11/2015   PLT 239 09/11/2015      Chemistry      Component Value Date/Time   NA 140 09/11/2015 1035   NA 140 03/19/2014 1100   K 4.5 09/11/2015 1035   K 4.5 03/19/2014 1100   CL 100 03/19/2014 1100   CO2 28 09/11/2015 1035   CO2 29 03/19/2014 1100   BUN 18.2 09/11/2015 1035   BUN 17 03/19/2014 1100   CREATININE 0.9 09/11/2015 1035   CREATININE 0.74 03/19/2014 1100      Component Value Date/Time   CALCIUM 9.5 09/11/2015 1035   CALCIUM 10.1 03/19/2014 1100   ALKPHOS 48 09/11/2015 1035   ALKPHOS 75 03/19/2014 1100   AST 21 09/11/2015 1035   AST 25 03/19/2014 1100   ALT <9 09/11/2015 1035   ALT 17 03/19/2014 1100   BILITOT <0.30 09/11/2015 1035   BILITOT 0.2* 03/19/2014 1100       No results found for: LABCA2  No components found for: LABCA125  No results for input(s): INR in the last 168 hours.  Urinalysis  No results found for: COLORURINE  STUDIES: CLINICAL DATA: 75 year old with  malignant lumpectomy of the outer left breast in 2015, pathology invasive ductal carcinoma and DCIS, negative sentinel lymph node, adjuvant radiation therapy, presenting with a palpable lump along the superior margin of the surgical scar which has been present for approximately 6 months, associated with erythema in the skin, without significant interval change.  EXAM: 2D DIGITAL DIAGNOSTIC LEFT MAMMOGRAM WITH CAD AND ADJUNCT TOMO  ULTRASOUND LEFT BREAST  COMPARISON: Mammography 05/05/2015 (left), 01/20/2015 (bilateral) and earlier. Left breast ultrasound 05/05/2015. Left breast ultrasound 01/20/2015 and 08/23/2014 were performed in a different part of the breast.  ACR Breast Density Category b: There are scattered areas of fibroglandular density.  FINDINGS: Standard 2D and tomosynthesis full field CC and MLO views of the left breast and a standard 2D and tomosynthesis spot compression tangential view of the area of palpable concern were obtained. Skin thickening related to the surgical scar in the area of palpable concern, unchanged. Post lumpectomy scarring in the outer left breast, unchanged. Post radiation trabecular thickening, unchanged. No new or suspicious findings in the left breast.  Mammographic images were processed with CAD.  On physical exam, there is erythema involving the skin along the lateral aspect of the scar in the upper outer periareolar left breast. There is no discrete palpable mass.  Targeted left breast ultrasound is performed, showing the surgical scar demonstrating acoustic shadowing, as expected. There is mild skin thickening corresponding to the area of erythema, but there is no underlying fluid collection to suggest abscess. No new suspicious solid mass or abnormal acoustic shadowing elsewhere is identified.  IMPRESSION: 1. Interval resolution of the previously identified fluid collection adjacent to the surgical scar in the upper  outer left breast. 2. Expected post lumpectomy and post radiation changes in the left breast. 3. No mammographic or sonographic evidence of malignancy, left breast.  RECOMMENDATION: Annual bilateral diagnostic mammography which is due in late July, 2017.  I have discussed the findings and recommendations with the patient. Results were also provided in writing at the conclusion of the visit. If applicable, a reminder letter will be sent to the patient regarding the next appointment.  BI-RADS CATEGORY 2: Benign.   Electronically Signed  By: Evangeline Dakin M.D.  On: 11/28/2015 11:42 The ASSESSMENT: 75 y.o. [place] woman s/p left upper-outer quadrant biopsy 01/16/2014 for a clinical T1b N0, stage IA invasive ductal breast cancer  (1) status post left lumpectomy and sentinel lymph node sampling 03/21/2014 for a pT1b pN0, stage IA invasive ductal carcinoma, repeat HER-2 again negative.  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  We reviewed the fact that tamoxifen can have a procoagulant effect and between tamoxifen and the patient's relative lack of activity, that may be sufficient reason for the lower extremity thrombosis. We reviewed the fact that thrombosis below the knee is generally less dangerous but nevertheless needs to be treated and she is being treated appropriately.  They wanted referral to a vascular surgeon. I really did not see the  need for that. We are going to repeat Doppler ultrasonography in September together with a d-dimer and she will see me shortly after that. If the clot has cleared we will consider stopping eliquis at that point.  She had an ultrasound and mammogram of the left breast to evaluate the area of erythema. Those studies did not show a mass. This does not appear to have changed since the last visit.. She also had a CT angiogram 12/25/2015 at [facility] and that showed no evidence of recurrent disease. She remains anxious regarding the possibility of cancer, but at this point we do not have any evidence that her cancer has recurred.  We discussed obtaining a PET scan but she would like to wait particularly because of concerns regarding cost and unnecessary radiation exposure.  As far as her pain is concern I'm increasing her tramadol. She understands we cannot move to narcotics in the absence of metastatic disease. If the tramadol does not work for her we will be glad to refer her to a pain clinic (where her granddaughter Amy already is followed). I did suggest that we add an antidepressant. Her daughter is taking cytology gram and I went ahead and wrote that as well for [place].  The area of erythema around the nipple in the left breast appears to me stable. However it has not resolved. There is a small possibility that this could represent angiosarcoma. I will ask the patient's surgeon to consider a punch biopsy.  Zoe Boone will return to see me 03/17/2016. She will have Doppler ultrasonography and lab work the day before. She will call me if any other problems develop before her next visit here.    Chauncey Cruel, MD   01/15/2016 5:21 PM

## 2016-01-19 ENCOUNTER — Other Ambulatory Visit: Payer: Self-pay | Admitting: General Surgery

## 2016-01-26 ENCOUNTER — Other Ambulatory Visit: Payer: Self-pay | Admitting: Oncology

## 2016-01-26 DIAGNOSIS — Z9889 Other specified postprocedural states: Secondary | ICD-10-CM

## 2016-01-26 DIAGNOSIS — Z853 Personal history of malignant neoplasm of breast: Secondary | ICD-10-CM

## 2016-01-30 ENCOUNTER — Ambulatory Visit
Admission: RE | Admit: 2016-01-30 | Discharge: 2016-01-30 | Disposition: A | Discharge status: Home / Self Care | Payer: Medicare Other | Source: Ambulatory Visit | Attending: Oncology | Admitting: Oncology

## 2016-01-30 DIAGNOSIS — Z853 Personal history of malignant neoplasm of breast: Secondary | ICD-10-CM

## 2016-01-30 DIAGNOSIS — Z9889 Other specified postprocedural states: Secondary | ICD-10-CM

## 2016-03-01 ENCOUNTER — Other Ambulatory Visit: Payer: Self-pay | Admitting: *Deleted

## 2016-03-01 DIAGNOSIS — I82402 Acute embolism and thrombosis of unspecified deep veins of left lower extremity: Secondary | ICD-10-CM

## 2016-03-01 DIAGNOSIS — C50412 Malignant neoplasm of upper-outer quadrant of left female breast: Secondary | ICD-10-CM

## 2016-03-11 ENCOUNTER — Other Ambulatory Visit: Payer: Self-pay | Admitting: Oncology

## 2016-03-12 ENCOUNTER — Other Ambulatory Visit: Payer: Self-pay | Admitting: Oncology

## 2016-03-15 ENCOUNTER — Telehealth: Payer: Self-pay

## 2016-03-15 NOTE — Telephone Encounter (Signed)
Faxed tramadol rx to [facility] pharmacy twice

## 2016-03-17 ENCOUNTER — Ambulatory Visit (HOSPITAL_COMMUNITY)
Admission: RE | Admit: 2016-03-17 | Discharge: 2016-03-17 | Disposition: A | Discharge status: Home / Self Care | Payer: Medicare Other | Source: Ambulatory Visit | Attending: Oncology | Admitting: Oncology

## 2016-03-17 ENCOUNTER — Ambulatory Visit (HOSPITAL_BASED_OUTPATIENT_CLINIC_OR_DEPARTMENT_OTHER): Payer: Medicare Other | Admitting: Oncology

## 2016-03-17 ENCOUNTER — Other Ambulatory Visit (HOSPITAL_BASED_OUTPATIENT_CLINIC_OR_DEPARTMENT_OTHER): Payer: Medicare Other

## 2016-03-17 ENCOUNTER — Telehealth: Payer: Self-pay | Admitting: Oncology

## 2016-03-17 VITALS — BP 149/80 | HR 85 | Temp 98.3°F | Resp 18 | Ht 62.0 in | Wt 123.4 lb

## 2016-03-17 DIAGNOSIS — C50412 Malignant neoplasm of upper-outer quadrant of left female breast: Secondary | ICD-10-CM

## 2016-03-17 DIAGNOSIS — I82442 Acute embolism and thrombosis of left tibial vein: Secondary | ICD-10-CM

## 2016-03-17 DIAGNOSIS — I82402 Acute embolism and thrombosis of unspecified deep veins of left lower extremity: Secondary | ICD-10-CM | POA: Diagnosis not present

## 2016-03-17 DIAGNOSIS — M25862 Other specified joint disorders, left knee: Secondary | ICD-10-CM | POA: Insufficient documentation

## 2016-03-17 DIAGNOSIS — Z170 Estrogen receptor positive status [ER+]: Secondary | ICD-10-CM

## 2016-03-17 LAB — COMPREHENSIVE METABOLIC PANEL
ALBUMIN: 3.8 g/dL (ref 3.5–5.0)
ALK PHOS: 56 U/L (ref 40–150)
ALT: 12 U/L (ref 0–55)
AST: 21 U/L (ref 5–34)
Anion Gap: 9 mEq/L (ref 3–11)
BUN: 17 mg/dL (ref 7.0–26.0)
CO2: 29 mEq/L (ref 22–29)
Calcium: 9.7 mg/dL (ref 8.4–10.4)
Chloride: 103 mEq/L (ref 98–109)
Creatinine: 1 mg/dL (ref 0.6–1.1)
EGFR: 56 mL/min/{1.73_m2} — AB (ref >90)
GLUCOSE: 109 mg/dL (ref 70–140)
Potassium: 4 mEq/L (ref 3.5–5.1)
Sodium: 142 mEq/L (ref 136–145)
TOTAL PROTEIN: 7 g/dL (ref 6.4–8.3)

## 2016-03-17 LAB — CBC WITH DIFFERENTIAL/PLATELET
BASO%: 0.9 % (ref 0.0–2.0)
Basophils Absolute: 0 10*3/uL (ref 0.0–0.1)
EOS ABS: 0.1 10*3/uL (ref 0.0–0.5)
EOS%: 2.4 % (ref 0.0–7.0)
HCT: 37.7 % (ref 34.8–46.6)
HEMOGLOBIN: 12.2 g/dL (ref 11.6–15.9)
LYMPH%: 25.9 % (ref 14.0–49.7)
MCH: 31 pg (ref 25.1–34.0)
MCHC: 32.4 g/dL (ref 31.5–36.0)
MCV: 95.8 fL (ref 79.5–101.0)
MONO#: 0.3 10*3/uL (ref 0.1–0.9)
MONO%: 7.9 % (ref 0.0–14.0)
NEUT%: 62.9 % (ref 38.4–76.8)
NEUTROS ABS: 2.8 10*3/uL (ref 1.5–6.5)
PLATELETS: 250 10*3/uL (ref 145–400)
RBC: 3.94 10*6/uL (ref 3.70–5.45)
RDW: 12.5 % (ref 11.2–14.5)
WBC: 4.4 10*3/uL (ref 3.9–10.3)
lymph#: 1.1 10*3/uL (ref 0.9–3.3)

## 2016-03-17 MED ORDER — TRAMADOL HCL 50 MG PO TABS: 50.0000 mg | ORAL_TABLET | Freq: Two times a day (BID) | ORAL | 3 refills | Status: DC | PRN

## 2016-03-17 NOTE — Progress Notes (Signed)
*  PRELIMINARY RESULTS* Vascular Ultrasound Left lower extremity venous duplex has been completed.  Preliminary findings: No evidence of DVT. Left Baker's cyst noted.   Called results to [place].   Landry Mellow, RDMS, RVT  03/17/2016, 1:09 PM

## 2016-03-17 NOTE — Telephone Encounter (Signed)
appt made and avs printed °

## 2016-03-17 NOTE — Progress Notes (Signed)
Zoe Boone  Telephone:(336) [phone] Fax:(336) [phone]     ID: CINTYA DAUGHETY DOB: Oct 02, 1940  MR#: [id number]  UUV#:[id number]  Patient Care Team: Raina Mina, MD as PCP - General (Internal Medicine) Chauncey Cruel, MD as Consulting Physician (Oncology) Thea Silversmith, MD as Consulting Physician (Radiation Oncology) Richardo Priest, MD as Referring Physician (Cardiology) Fanny Skates, MD as Consulting Physician (General Surgery)  OTHER MD: Gatha Mayer M.D., Everlene Farrier M.D.  CHIEF COMPLAINT: estrogen receptor positive breast cancer  CURRENT TREATMENT: tamothank youxifen  BREAST CANCER HISTORY: From the original intake note:  "[name]" had routine screening mammography in [facility] showing a possible mass in the upper left breast. Additional views and left breast ultrasonography confirmed a 7 mm mass in the upper-outer quadrant. This was biopsied under ultrasound guidance in [facility] 01/17/2014, and the pathology showed ([id number]) an invasive ductal carcinoma, E-cadherin positive, grade 1 or 2, estrogen receptor 99% positive, progesterone receptor 14% positive, with no amplification of HER-2, and with an MIB-1 of 17%.  On 02/19/2014 the patient underwent bilateral breast MRI. This showed a signal void in the superior left breast, but no abnormal enhancement, suggesting that most if not all of the original lesion was removed and biopsied.  The patient's subsequent history is as detailed below  INTERVAL HISTORY: [name] for follow-up of her left lower extremity clot and pain issues. Of course she also had a biopsy of the red area aroundthe left nipple, which showed only radiation dermatitis. (DAA [id number]).  Today she had a Doppler study of the left leg and this reportedly showed no evidence of residual clot. I do not yet have the official report. We also obtained a d-dimer today and those results are pending.  REVIEW OF  SYSTEMS: [name] Still has significant pain in her left lower leg. She bears with it during the day, and sometimes just has to sit down and elevate the leg, which takes care of it. At night it's worse. She has found that if she takes 2 tramadol at bedtime that can take care of the problem. She is not getting constipated from that medication. She is obtaining it at a Aside from these issues a detailed review of systems today was stable  PAST MEDICAL HISTORY: Past Medical History:  Diagnosis Date  . Anxiety   . Arthritis   . Cancer [facility]) [id number]   left breast mammary carcinoma  . GERD (gastroesophageal reflux disease)   . Hyperlipidemia   . Hypertension   . PONV (postoperative nausea and vomiting)   . Thyroid disease     PAST SURGICAL HISTORY: Past Surgical History:  Procedure Laterality Date  . ABDOMINAL HYSTERECTOMY  1985  . BLADDER SUSPENSION  1992  . BREAST SURGERY     multiple br bx  . BUNIONECTOMY  1990   lt/rt  . CATARACT EXTRACTION     bilat.   . COLONOSCOPY  2014  . KNEE ARTHROSCOPY  2010   right     FAMILY HISTORY Family History  Problem Relation Age of Onset  . Leukemia Father   . Diabetes Father   . Cancer Father     luekemia  . Skin cancer Mother   . Hypertension Mother   . Kidney cancer Brother   . Melanoma Brother    the patient's father died from complications of chronic leukemia at the age of 60. He had been diagnosed at age 66. The patient's mother died from "old age" at age  90. The patient has 3 brothers and 2 sisters. One brother was diagnosed with kidney cancer at the age of 34. There is one maternal aunt (out of 2) with breast cancer diagnosed at age 13, , and one paternal aunt (57) diagnosed with breast cancer at age 44. There is no history of ovarian cancer in the family.  GYNECOLOGIC HISTORY:  No LMP recorded. Patient has had a hysterectomy. Menarche age 51, first live birth age 50. The patient is GX P2. She underwent abdominal hysterectomy and  subsequent bilateral salpingo-oophorectomy at age 77. She took hormone replacement for approximately 10 years.  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 Her husband [name] "Marguerite Olea" Woodring is retired from [employer]. It suggests that 2 of them at home, with no pets. Their daughter [name] is a Education officer, museum in [place], as is your second daughter, [name]. The patient has 4 grandchildren, including a granddaughter who is just entering medical school in [place], 2 grandsons at [employer], and a fourth is a Print production planner. The patient attends a local [name]     ADVANCED DIRECTIVES: Not in place   HEALTH MAINTENANCE: Social History  Substance Use Topics  . Smoking status: Never Smoker  . Smokeless tobacco: Not on file  . Alcohol use No     Colonoscopy: Up to date/[name]  PAP:  Bone density:  Lipid panel:  Allergies  Allergen Reactions  . Aspirin Nausea Only    Gi upset    Current Outpatient Prescriptions  Medication Sig Dispense Refill  . ALPRAZolam (XANAX) 0.25 MG tablet Take 0.25 mg by mouth at bedtime as needed for anxiety.    . benazepril (LOTENSIN) 20 MG tablet Take 20 mg by mouth daily.    . butalbital-acetaminophen-caffeine (FIORICET, ESGIC) 50-325-40 MG per tablet TAKE 1 OR 2 TABLETS EVERY 6 HOURS AS NEEDED FOR SEVERE HEADACHE  4  . Cholecalciferol 1000 UNITS capsule Take 2,000 Units by mouth daily.     . citalopram (CELEXA) 10 MG tablet Take 1 tablet (10 mg total) by mouth daily. 30 tablet 3  . levothyroxine (SYNTHROID, LEVOTHROID) 75 MCG tablet Take 75 mcg by mouth daily before breakfast.    . metoprolol tartrate (LOPRESSOR) 25 MG tablet Take 25 mg by mouth 2 (two) times daily.  3  . Multiple Vitamin (MULTIVITAMIN) tablet Take 1 tablet by mouth daily.    . nitroGLYCERIN (NITROSTAT) 0.4 MG SL tablet Place 0.4 mg under the tongue every 5 (five) minutes as needed for chest  pain. Reported on 11/10/2015    . omeprazole (PRILOSEC) 20 MG capsule Take 20 mg by mouth daily as needed.     . pravastatin (PRAVACHOL) 40 MG tablet Take 40 mg by mouth daily.    . temazepam (RESTORIL) 15 MG capsule Take 15 mg by mouth at bedtime as needed for sleep.    . traMADol (ULTRAM) 50 MG tablet Take 1-2 tablets (50-100 mg total) by mouth every 12 (twelve) hours as needed. 120 tablet 3   No current facility-administered medications for this visit.     OBJECTIVE: Middle-aged white woman  Vitals:   03/17/16 1526  BP: (!) 149/80  Pulse: 85  Resp: 18  Temp: 98.3 F (36.8 C)     Body mass index is 22.57 kg/m.    ECOG FS:2 - Symptomatic, <50% confined to bed  Sclerae unicteric, pupils round and equal Oropharynx clear and moist--  no thrush or other lesions No cervical or supraclavicular adenopathy Lungs no rales or rhonchi Heart regular rate and rhythm Abd soft, nontender, positive bowel sounds MSK no focal spinal tenderness, no lower extremity lymphedema Neuro: nonfocal, well oriented, appropriate affect Breasts: the right breast is benign. The left breast is status post lumpectomy and radiation and recent biopsy. There is still an mild erythema associated with the areola, perhaps a little bit diminished as compared to the photos below. The left axilla is benign     Left breast, 09/11/2015    LAB RESULTS:  This is Dr. Jana Hakim  CMP     Component Value Date/Time   NA 140 09/11/2015 1035   K 4.5 09/11/2015 1035   CL 100 03/19/2014 1100   CO2 28 09/11/2015 1035   GLUCOSE 90 09/11/2015 1035   BUN 18.2 09/11/2015 1035   CREATININE 0.9 09/11/2015 1035   CALCIUM 9.5 09/11/2015 1035   PROT 7.0 09/11/2015 1035   ALBUMIN 4.0 09/11/2015 1035   AST 21 09/11/2015 1035   ALT <9 09/11/2015 1035   ALKPHOS 48 09/11/2015 1035   BILITOT <0.30 09/11/2015 1035   GFRNONAA 84 (L) 03/19/2014 1100   GFRAA >90 03/19/2014 1100    I No results found for: SPEP  Lab Results    Component Value Date   WBC 4.4 03/17/2016   NEUTROABS 2.8 03/17/2016   HGB 12.2 03/17/2016   HCT 37.7 03/17/2016   MCV 95.8 03/17/2016   PLT 250 03/17/2016      Chemistry      Component Value Date/Time   NA 140 09/11/2015 1035   K 4.5 09/11/2015 1035   CL 100 03/19/2014 1100   CO2 28 09/11/2015 1035   BUN 18.2 09/11/2015 1035   CREATININE 0.9 09/11/2015 1035      Component Value Date/Time   CALCIUM 9.5 09/11/2015 1035   ALKPHOS 48 09/11/2015 1035   AST 21 09/11/2015 1035   ALT <9 09/11/2015 1035   BILITOT <0.30 09/11/2015 1035       No results found for: LABCA2  No components found for: LABCA125  No results for input(s): INR in the last 168 hours.  Urinalysis No results found for: COLORURINE  STUDIES: No results found.  ASSESSMENT: 75 y.o. [place] woman s/p left upper-outer quadrant biopsy 01/16/2014 for a clinical T1b N0, stage IA invasive ductal breast cancer  (1) status post left lumpectomy and sentinel lymph node sampling 03/21/2014 for a pT1b pN0, stage IA invasive ductal carcinoma, repeat HER-2 again negative.  (2) adjuvant radiation completed 06/17/2014 in [place]  (3) started tamoxifen 07/05/2014--stopped 12/25/2015 with the development of a left lower extremity clot  (a) status post remote TAH-BSO  (4) left adrenal adenoma noted on CT / angiogram of the chest 12/25/2015 often-requires only follow-up  (5) left lower extremity duplex ultrasound 12/25/2015 showed occlusive thrombus in the posterior tibial and peroneal veins  (a) apixaban/Eliquis started 12/25/2015  (b) repeat Doppler ultrasonography 03/17/2016 reportedly negative  (6) area of erythema lateral to the left areola biopsy July 2017 and 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 However I do not yet have the official report so I am not quite ready to take her off the  apixaban. Also we drew a d-dimer today. Assuming all that is favorable, I will call her in the next 2 days and we can stop the blood thinner.  She is benefiting  at night from the tramadol. She is taking 100 mg at bedtime and not at all during the day. I think this is not an unreasonable way for her to use this drug so I gave her a refill. She is aware of the possible toxicities, side effects and complications of tramadol. She will let me know if any problems develop.  He continues to have significant problem in the left lower extremity, just over the ankle. There is no focal finding there. I offered consideration of an orthopedic referral but at this point she would like to defer.  I also reassured her that the biopsy of the area of erythema in the left breast is benign  She is going to see me again in one year. When she returns to see me I hope things will have settled down sufficiently that she will be able to consider starting anastrozole  She will call with any problems that may develop before that visit.  Chauncey Cruel, MD   03/17/2016 3:47 PM

## 2016-03-18 LAB — D-DIMER, QUANTITATIVE (NOT AT ARMC): D-DIMER: 0.23 mg{FEU}/L (ref 0.00–0.49)

## 2016-03-21 ENCOUNTER — Other Ambulatory Visit: Payer: Self-pay | Admitting: Oncology

## 2016-03-21 ENCOUNTER — Encounter: Payer: Self-pay | Admitting: Oncology

## 2016-03-21 NOTE — Progress Notes (Signed)
Called pt and stopped apixaban (negative doppler, normal Ddimer)

## 2016-03-22 ENCOUNTER — Telehealth: Payer: Self-pay | Admitting: *Deleted

## 2016-03-22 NOTE — Telephone Encounter (Signed)
Called pt per Dr Magrinat's request & verified that she received his message yesterday to stop apixaban.  She has stopped.

## 2016-05-12 ENCOUNTER — Ambulatory Visit: Payer: Medicare Other | Admitting: Oncology

## 2016-05-12 ENCOUNTER — Other Ambulatory Visit: Payer: Medicare Other

## 2016-05-18 ENCOUNTER — Other Ambulatory Visit: Payer: Self-pay | Admitting: *Deleted

## 2016-05-18 MED ORDER — TRAMADOL HCL 50 MG PO TABS: 50.0000 mg | ORAL_TABLET | Freq: Two times a day (BID) | ORAL | 3 refills | Status: DC | PRN

## 2016-12-03 ENCOUNTER — Other Ambulatory Visit: Payer: Self-pay | Admitting: Oncology

## 2016-12-03 DIAGNOSIS — Z853 Personal history of malignant neoplasm of breast: Secondary | ICD-10-CM

## 2016-12-10 ENCOUNTER — Other Ambulatory Visit: Payer: Self-pay | Admitting: *Deleted

## 2016-12-10 MED ORDER — TRAMADOL HCL 50 MG PO TABS: 50.0000 mg | ORAL_TABLET | Freq: Two times a day (BID) | ORAL | 0 refills | Status: DC | PRN

## 2017-01-31 ENCOUNTER — Ambulatory Visit
Admission: RE | Admit: 2017-01-31 | Discharge: 2017-01-31 | Disposition: A | Discharge status: Home / Self Care | Payer: Medicare Other | Source: Ambulatory Visit

## 2017-01-31 ENCOUNTER — Ambulatory Visit
Admission: RE | Admit: 2017-01-31 | Discharge: 2017-01-31 | Disposition: A | Discharge status: Home / Self Care | Payer: Medicare Other | Source: Ambulatory Visit | Attending: Oncology | Admitting: Oncology

## 2017-01-31 ENCOUNTER — Other Ambulatory Visit: Payer: Self-pay

## 2017-01-31 DIAGNOSIS — Z853 Personal history of malignant neoplasm of breast: Secondary | ICD-10-CM

## 2017-01-31 DIAGNOSIS — R52 Pain, unspecified: Secondary | ICD-10-CM

## 2017-01-31 HISTORY — DX: Personal history of irradiation: Z92.3

## 2017-03-15 ENCOUNTER — Other Ambulatory Visit (HOSPITAL_BASED_OUTPATIENT_CLINIC_OR_DEPARTMENT_OTHER): Payer: Medicare Other

## 2017-03-15 ENCOUNTER — Other Ambulatory Visit: Payer: Self-pay

## 2017-03-15 ENCOUNTER — Ambulatory Visit (HOSPITAL_BASED_OUTPATIENT_CLINIC_OR_DEPARTMENT_OTHER): Payer: Medicare Other | Admitting: Oncology

## 2017-03-15 VITALS — BP 197/87 | HR 65 | Temp 98.1°F | Resp 18 | Ht 62.0 in | Wt 124.4 lb

## 2017-03-15 DIAGNOSIS — C50412 Malignant neoplasm of upper-outer quadrant of left female breast: Secondary | ICD-10-CM | POA: Diagnosis not present

## 2017-03-15 DIAGNOSIS — E079 Disorder of thyroid, unspecified: Secondary | ICD-10-CM | POA: Diagnosis not present

## 2017-03-15 DIAGNOSIS — E038 Other specified hypothyroidism: Secondary | ICD-10-CM

## 2017-03-15 DIAGNOSIS — Z170 Estrogen receptor positive status [ER+]: Secondary | ICD-10-CM | POA: Diagnosis not present

## 2017-03-15 LAB — COMPREHENSIVE METABOLIC PANEL
ALT: 14 U/L (ref 0–55)
AST: 24 U/L (ref 5–34)
Albumin: 4.1 g/dL (ref 3.5–5.0)
Alkaline Phosphatase: 69 U/L (ref 40–150)
Anion Gap: 6 mEq/L (ref 3–11)
BILIRUBIN TOTAL: 0.23 mg/dL (ref 0.20–1.20)
BUN: 15.9 mg/dL (ref 7.0–26.0)
CHLORIDE: 100 meq/L (ref 98–109)
CO2: 28 meq/L (ref 22–29)
CREATININE: 0.8 mg/dL (ref 0.6–1.1)
Calcium: 9.8 mg/dL (ref 8.4–10.4)
EGFR: 74 mL/min/{1.73_m2} — ABNORMAL LOW (ref >90)
GLUCOSE: 91 mg/dL (ref 70–140)
Potassium: 4.5 mEq/L (ref 3.5–5.1)
SODIUM: 134 meq/L — AB (ref 136–145)
Total Protein: 7 g/dL (ref 6.4–8.3)

## 2017-03-15 LAB — CBC WITH DIFFERENTIAL/PLATELET
BASO%: 1.1 % (ref 0.0–2.0)
BASOS ABS: 0 10*3/uL (ref 0.0–0.1)
EOS ABS: 0.1 10*3/uL (ref 0.0–0.5)
EOS%: 2 % (ref 0.0–7.0)
HEMATOCRIT: 36.3 % (ref 34.8–46.6)
HEMOGLOBIN: 12.3 g/dL (ref 11.6–15.9)
LYMPH#: 1.2 10*3/uL (ref 0.9–3.3)
LYMPH%: 28.8 % (ref 14.0–49.7)
MCH: 32.9 pg (ref 25.1–34.0)
MCHC: 33.7 g/dL (ref 31.5–36.0)
MCV: 97.6 fL (ref 79.5–101.0)
MONO#: 0.3 10*3/uL (ref 0.1–0.9)
MONO%: 7.8 % (ref 0.0–14.0)
NEUT%: 60.3 % (ref 38.4–76.8)
NEUTROS ABS: 2.4 10*3/uL (ref 1.5–6.5)
Platelets: 252 10*3/uL (ref 145–400)
RBC: 3.72 10*6/uL (ref 3.70–5.45)
RDW: 12.7 % (ref 11.2–14.5)
WBC: 4.1 10*3/uL (ref 3.9–10.3)

## 2017-03-15 LAB — TSH: TSH: 2.681 m(IU)/L (ref 0.308–3.960)

## 2017-03-15 MED ORDER — TRAMADOL HCL 50 MG PO TABS: 50.0000 mg | ORAL_TABLET | Freq: Two times a day (BID) | ORAL | 0 refills | Status: DC | PRN

## 2017-03-15 NOTE — Progress Notes (Signed)
[place]  Telephone:(336) [phone] Fax:(336) [phone]     ID: Zoe Boone DOB: 03-05-1941  MR#: [id number]  GBT#:[id number]  Patient Care Team: Raina Mina., MD as PCP - General (Internal Medicine) Serena Petterson, Virgie Dad, MD as Consulting Physician (Oncology) Thea Silversmith, MD (Inactive) as Consulting Physician (Radiation Oncology) Richardo Priest, MD as Referring Physician (Cardiology) Fanny Skates, MD as Consulting Physician (General Surgery)  OTHER MD: Gatha Mayer M.D., Everlene Farrier M.D.  CHIEF COMPLAINT: estrogen receptor positive breast cancer  CURRENT TREATMENT: Observation  BREAST CANCER HISTORY: From the original intake note:  "Zoe Boone" had routine screening mammography in [facility] showing a possible mass in the upper left breast. Additional views and left breast ultrasonography confirmed a 7 mm mass in the upper-outer quadrant. This was biopsied under ultrasound guidance in [facility] 01/17/2014, and the pathology showed ([id number]) an invasive ductal carcinoma, E-cadherin positive, grade 1 or 2, estrogen receptor 99% positive, progesterone receptor 14% positive, with no amplification of HER-2, and with an MIB-1 of 17%.  On 02/19/2014 the patient underwent bilateral breast MRI. This showed a signal void in the superior left breast, but no abnormal enhancement, suggesting that most if not all of the original lesion was removed and biopsied.  The patient's subsequent history is as detailed below  INTERVAL HISTORY: Zoe Boone returns today for follow-up of her estrogen receptor positive breast cancer. She reports that she still has LLE pain, left ankle and foot pain. Pt wears compression socks daily. She completes ROM exercises to aid with her pain. She is still taking from 1-2 tablets of Rx Tramadol daily and is requesting a refill.   Pt was taken off Tamoxifen when she was diagnosed with a DVT. She has not been placed back on  anti-estrogens. This was discussed extensively today.  REVIEW OF SYSTEMS: Zoe Boone still has LLE pain (left ankle and foot pain) which is mildly alleviated with compression stockings. Denies constipation. She reports left breast itching. She reports right-sided HA that are becoming more frequent with her most recent being this morning and has approximately once a week. She reports dizziness. Denies bowel issues. A detailed review of systems today was otherwise stable.   PAST MEDICAL HISTORY: Past Medical History:  Diagnosis Date  . Anxiety   . Arthritis   . Cancer [facility]) [id number]   left breast mammary carcinoma  . GERD (gastroesophageal reflux disease)   . Hyperlipidemia   . Hypertension   . Personal history of radiation therapy   . PONV (postoperative nausea and vomiting)   . Thyroid disease     PAST SURGICAL HISTORY: Past Surgical History:  Procedure Laterality Date  . ABDOMINAL HYSTERECTOMY  1985  . BLADDER SUSPENSION  1992  . BREAST SURGERY     multiple br bx  . BUNIONECTOMY  1990   lt/rt  . CATARACT EXTRACTION     bilat.   . COLONOSCOPY  2014  . KNEE ARTHROSCOPY  2010   right     FAMILY HISTORY Family History  Problem Relation Age of Onset  . Leukemia Father   . Diabetes Father   . Cancer Father        luekemia  . Skin cancer Mother   . Hypertension Mother   . Kidney cancer Brother   . Melanoma Brother    the patient's father died from complications of chronic leukemia at the age of 6. He had been diagnosed at age 7. The patient's mother died from "old age" at age  90. The patient has 3 brothers and 2 sisters. One brother was diagnosed with kidney cancer at the age of 79. There is one maternal aunt (out of 2) with breast cancer diagnosed at age 67, , and one paternal aunt (80) diagnosed with breast cancer at age 75. There is no history of ovarian cancer in the family.  GYNECOLOGIC HISTORY:  No LMP recorded. Patient has had a hysterectomy. Menarche age 72, first live  birth age 72. The patient is GX P2. She underwent abdominal hysterectomy and subsequent bilateral salpingo-oophorectomy at age 68. She took hormone replacement for approximately 10 years.  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 Her husband [name] "Marguerite Olea" Stillion is retired from [employer]. It suggests that 2 of them at home, with no pets. Their daughter [name] is a Education officer, museum in [place], as is your second daughter, [name]. The patient has 4 grandchildren, including a granddaughter who is just entering medical school in [place], 2 grandsons at [employer], and a fourth is a Print production planner. The patient attends a local [name]     ADVANCED DIRECTIVES: Not in place   HEALTH MAINTENANCE: Social History  Substance Use Topics  . Smoking status: Never Smoker  . Smokeless tobacco: Not on file  . Alcohol use No     Colonoscopy: Up to date/[name]  PAP:  Bone density:  Lipid panel:  Allergies  Allergen Reactions  . Aspirin Nausea Only    Gi upset    Current Outpatient Prescriptions  Medication Sig Dispense Refill  . ALPRAZolam (XANAX) 0.25 MG tablet Take 0.25 mg by mouth at bedtime as needed for anxiety.    . benazepril (LOTENSIN) 20 MG tablet Take 20 mg by mouth daily.    . butalbital-acetaminophen-caffeine (FIORICET, ESGIC) 50-325-40 MG per tablet TAKE 1 OR 2 TABLETS EVERY 6 HOURS AS NEEDED FOR SEVERE HEADACHE  4  . Cholecalciferol 1000 UNITS capsule Take 2,000 Units by mouth daily.     . citalopram (CELEXA) 10 MG tablet Take 1 tablet (10 mg total) by mouth daily. 30 tablet 3  . levothyroxine (SYNTHROID, LEVOTHROID) 75 MCG tablet Take 75 mcg by mouth daily before breakfast.    . metoprolol tartrate (LOPRESSOR) 25 MG tablet Take 25 mg by mouth 2 (two) times daily.  3  . Multiple Vitamin (MULTIVITAMIN) tablet Take 1 tablet by mouth daily.    . nitroGLYCERIN (NITROSTAT) 0.4 MG SL  tablet Place 0.4 mg under the tongue every 5 (five) minutes as needed for chest pain. Reported on 11/10/2015    . omeprazole (PRILOSEC) 20 MG capsule Take 20 mg by mouth daily as needed.     . pravastatin (PRAVACHOL) 40 MG tablet Take 40 mg by mouth daily.    . temazepam (RESTORIL) 15 MG capsule Take 15 mg by mouth at bedtime as needed for sleep.    . traMADol (ULTRAM) 50 MG tablet Take 1-2 tablets (50-100 mg total) by mouth every 12 (twelve) hours as needed. 120 tablet 0   No current facility-administered medications for this visit.     OBJECTIVE: Middle-aged white woman Who appears stated age 69:   03/15/17 1140  BP: (!) 197/87  Pulse: 65  Resp: 18  Temp: 98.1 F (36.7 C)  SpO2: 100%     Body mass index is 22.75 kg/m.    ECOG FS:2 - Symptomatic, <50% confined to bed  Sclerae unicteric, EOMs intact  Oropharynx clear and moist No cervical or supraclavicular adenopathy Lungs no rales or rhonchi Heart regular rate and rhythm Abd soft, nontender, positive bowel sounds MSK no focal spinal tenderness, no upper extremity lymphedema Neuro: nonfocal, well oriented, depressive affect Breasts: The right breast is unremarkable. The left breast as undergone lumpectomy and radiation. The area of erythema associated with the nipple imaged below is considerably less pink than before. There is no evidence of disease recurrence. Both axillae are benign.  Left breast, 09/11/2015    LAB RESULTS:  This is Dr. Jana Hakim  CMP     Component Value Date/Time   NA 134 (L) 03/15/2017 1103   K 4.5 03/15/2017 1103   CL 100 03/19/2014 1100   CO2 28 03/15/2017 1103   GLUCOSE 91 03/15/2017 1103   BUN 15.9 03/15/2017 1103   CREATININE 0.8 03/15/2017 1103   CALCIUM 9.8 03/15/2017 1103   PROT 7.0 03/15/2017 1103   ALBUMIN 4.1 03/15/2017 1103   AST 24 03/15/2017 1103   ALT 14 03/15/2017 1103   ALKPHOS 69 03/15/2017 1103   BILITOT 0.23 03/15/2017 1103   GFRNONAA 84 (L) 03/19/2014 1100   GFRAA >90  03/19/2014 1100    I No results found for: SPEP  Lab Results  Component Value Date   WBC 4.1 03/15/2017   NEUTROABS 2.4 03/15/2017   HGB 12.3 03/15/2017   HCT 36.3 03/15/2017   MCV 97.6 03/15/2017   PLT 252 03/15/2017      Chemistry      Component Value Date/Time   NA 134 (L) 03/15/2017 1103   K 4.5 03/15/2017 1103   CL 100 03/19/2014 1100   CO2 28 03/15/2017 1103   BUN 15.9 03/15/2017 1103   CREATININE 0.8 03/15/2017 1103      Component Value Date/Time   CALCIUM 9.8 03/15/2017 1103   ALKPHOS 69 03/15/2017 1103   AST 24 03/15/2017 1103   ALT 14 03/15/2017 1103   BILITOT 0.23 03/15/2017 1103       No results found for: LABCA2  No components found for: LABCA125  No results for input(s): INR in the last 168 hours.  Urinalysis No results found for: COLORURINE  STUDIES: Bilateral diagnostic mamm[facility] with [facility] found a breast density to be category C. There was no evidence of malignancy.  ASSESSMENT: 76 y.o. [place] woman s/p left upper-outer quadrant biopsy 01/16/2014 for a clinical T1b N0, stage IA invasive ductal breast cancer  (1) status post left lumpectomy and sentinel lymph node sampling 03/21/2014 for a pT1b pN0, stage IA invasive ductal carcinoma, repeat HER-2 again negative.  (2) adjuvant radiation completed 06/17/2014 in [place]  (3) started tamoxifen 07/05/2014--stopped 12/25/2015 with the development of a left lower extremity clot  (a) status post remote TAH-BSO  (4) left adrenal adenoma noted on CT / angiogram of the chest 12/25/2015 often-requires only follow-up  (5) left lower extremity duplex ultrasound 12/25/2015 showed occlusive thrombus in the posterior tibial and peroneal veins  (a) apixaban/Eliquis started 12/25/2015  (b) repeat Doppler ultrasonography 03/17/2016 reportedly negative--Apixaban stopped  (6) area of erythema lateral to the left areola biopsy July 2017 and  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 This is very favorable.  Today we discussed anastrozole. She has a good understanding of the possible toxicities side effects  and complications of this agent. After considering she really does not think she can handle any more medication at this point.  I reassured her that I do not believe she has cancer in her brain. However she then told me that she had had a fall and hit her head. I urged her to go ahead and get a CT of the brain without contrast distal make sure she did not have a bleed. She tells me she is going to discuss it with her daughter. In the meantime I have entered the order through [facility] at her request. She will decide when she is called by them whether she wants to proceed with that or not.  I refilled her tramadol today. I also wrote her a prescription for graduated compression stockings.  From a breast cancer point of view there is no evidence of disease activity. She will return to see me in one year. She knows to call for any problems that may develop before that visit.  Luay Balding, Virgie Dad, MD  03/15/17 12:44 PM Medical Oncology and Hematology [facility] [address] [place], [place] [address] Tel. [phone]    Fax. [phone]  This document serves as a record of services personally performed by Lurline Del, MD. It was created on her behalf by Steva Colder, a trained medical scribe. The creation of this record is based on the scribe's personal observations and the provider's statements to them. This document has been checked and approved by the attending provider.

## 2017-03-17 ENCOUNTER — Ambulatory Visit: Payer: Medicare Other | Admitting: Oncology

## 2017-03-17 ENCOUNTER — Other Ambulatory Visit: Payer: Medicare Other

## 2017-05-04 ENCOUNTER — Telehealth: Payer: Self-pay | Admitting: *Deleted

## 2017-05-04 NOTE — Telephone Encounter (Signed)
Voicemail received requesting "Dr. Jana Hakim to look over CT of kidneys and stomach my urologist ordered.  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  I've had left breast cancer so this really concerns me."  Message forwarded to Collaborative.  Further patient communication from collaborative nurse.

## 2017-05-05 ENCOUNTER — Other Ambulatory Visit: Payer: Self-pay | Admitting: Oncology

## 2017-05-05 ENCOUNTER — Other Ambulatory Visit: Payer: Self-pay | Admitting: *Deleted

## 2017-05-05 DIAGNOSIS — C50412 Malignant neoplasm of upper-outer quadrant of left female breast: Secondary | ICD-10-CM

## 2017-05-05 DIAGNOSIS — R1084 Generalized abdominal pain: Secondary | ICD-10-CM

## 2017-05-05 DIAGNOSIS — K862 Cyst of pancreas: Secondary | ICD-10-CM

## 2017-05-05 DIAGNOSIS — R935 Abnormal findings on diagnostic imaging of other abdominal regions, including retroperitoneum: Secondary | ICD-10-CM

## 2017-05-05 DIAGNOSIS — Z170 Estrogen receptor positive status [ER+]: Principal | ICD-10-CM

## 2017-05-05 DIAGNOSIS — K8689 Other specified diseases of pancreas: Secondary | ICD-10-CM

## 2017-05-05 NOTE — Telephone Encounter (Signed)
Results received and reviewed by MD. No noted obvious concern with area in pancreas identified as " a cyst "- per Dr Virgie Dad review - recommendation is for MRI to be done in December with MD follow up a couple of days post obtaining.  Above discussed with the patient - who requested MRI to be done at [facility] ( where she had CT ) .  Zoe Boone stated appreciation of Dr Magrinat's review.  Orders entered per above.

## 2017-05-08 ENCOUNTER — Telehealth: Payer: Self-pay | Admitting: Oncology

## 2017-05-08 NOTE — Telephone Encounter (Signed)
Lvm advising appts 12/11 at 8.15 and 12/19 at 3.30. Also advised in vm, md ordered MRI and that central sched will call to arrange appt. Also mailed appt calendar.

## 2017-05-13 ENCOUNTER — Telehealth: Payer: Self-pay | Admitting: *Deleted

## 2017-05-13 NOTE — Telephone Encounter (Signed)
This RN spoke with pt per her call stating she may have to have labs again for the MRI that is scheduled at [place] on Monday.  Per above - this RN informed her MD request is to obtain the scan in December prior to her visit with him- discussed concern that MRI is to evaluate questionable areas on recent CT and need for re-evaluation too soon would not give [place] appropriate information.   Per above conversation- pt verbalized understanding- this RN called [facility] and rescheduled MRI to 06/14/2017.

## 2017-05-21 ENCOUNTER — Other Ambulatory Visit: Payer: Self-pay | Admitting: Oncology

## 2017-06-14 ENCOUNTER — Other Ambulatory Visit: Payer: Medicare Other

## 2017-06-20 ENCOUNTER — Telehealth: Payer: Self-pay

## 2017-06-20 NOTE — Telephone Encounter (Signed)
Per pt request have contacted [facility] health to have her most recent labs and abdominal MRI faxed to [place].

## 2017-06-21 NOTE — Progress Notes (Signed)
[facility]  Telephone:([phone] Fax:([phone]) [phone]     ID: SHANINA KEPPLE DOB: 10/10/1940  MR#: [id number]  FMB#:[id number]  Patient Care Team: Raina Mina., MD as PCP - General (Internal Medicine) Magrinat, Virgie Dad, MD as Consulting Physician (Oncology) Thea Silversmith, MD (Inactive) as Consulting Physician (Radiation Oncology) Richardo Priest, MD as Referring Physician (Cardiology) Fanny Skates, MD as Consulting Physician (General Surgery)  OTHER MD: Gatha Mayer M.D., Everlene Farrier M.D.  CHIEF COMPLAINT: estrogen receptor positive breast cancer  CURRENT TREATMENT: Observation  BREAST CANCER HISTORY: From the original intake note:  "Zoe Boone" had routine screening mammography in [facility] showing a possible mass in the upper left breast. Additional views and left breast ultrasonography confirmed a 7 mm mass in the upper-outer quadrant. This was biopsied under ultrasound guidance in [facility] 01/17/2014, and the pathology showed ([id number]) an invasive ductal carcinoma, E-cadherin positive, grade 1 or 2, estrogen receptor 99% positive, progesterone receptor 14% positive, with no amplification of HER-2, and with an MIB-1 of 17%.  On 02/19/2014 the patient underwent bilateral breast MRI. This showed a signal void in the superior left breast, but no abnormal enhancement, suggesting that most if not all of the original lesion was removed and biopsied.  The patient's subsequent history is as detailed below  INTERVAL HISTORY: Zoe Boone returns today for follow-up of her estrogen receptor positive breast cancer.  She continues under observation alone.  Since her last visit here she was evaluated for her cystic pancreatic lesion with an MRI of the abdomen with and without contrast at [facility].  This was performed on 06/15/2017.  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  This is unchanged as compared to a CT scan from  October 2018.  The pancreatic duct appears normal.  The cyst is simple.  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  REVIEW OF SYSTEMS: Zoe Boone has been experiencing left lower leg pain that makes it hard for her to ambulate for long periods of time. Walking on hard wood floors and cold weather exacerbates her pain. She has tried to massage the area with mild to no relief. There are some days when she is more fatigued than others and she will take 30-60 minutes naps. At this time she will  When she is able to rest she puts her leg up which will mildly alleviate her discomfort. Sometimes she will massage her leg, elevate it and stretch it for relief. She tries not to take tramadol unless needed, bt reports taking it at night helps to relax her, allowing her to sleep. The tramadol has not made her constipated.   The patient has also been experimenting worsening moderate-severe migraines. She will take fiorcet as needed. .    She hasnt' been to church because of some recent changes that have occurred there.  Increased migraine head aches, worse in the right eye. Yesterday she noticed her eye was yellow.    A detailed review of systems was otherwise entirely stable.    PAST MEDICAL HISTORY: Past Medical History:  Diagnosis Date  . Anxiety   . Arthritis   . Cancer [facility]) [id number]   left breast mammary carcinoma  . GERD (gastroesophageal reflux disease)   . Hyperlipidemia   . Hypertension   . Personal history of radiation therapy   . PONV (postoperative nausea  and vomiting)   . Thyroid disease     PAST SURGICAL HISTORY: Past Surgical History:  Procedure Laterality Date  . ABDOMINAL HYSTERECTOMY  1985  . BLADDER SUSPENSION  1992  . BREAST SURGERY     multiple br bx  . BUNIONECTOMY  1990   lt/rt  . CATARACT EXTRACTION     bilat.   . COLONOSCOPY  2014  . KNEE ARTHROSCOPY   2010   right     FAMILY HISTORY Family History  Problem Relation Age of Onset  . Leukemia Father   . Diabetes Father   . Cancer Father        luekemia  . Skin cancer Mother   . Hypertension Mother   . Kidney cancer Brother   . Melanoma Brother    the patient's father died from complications of chronic leukemia at the age of 75. He had been diagnosed at age 69. The patient's mother died from "old age" at age 4. The patient has 3 brothers and 2 sisters. One brother was diagnosed with kidney cancer at the age of 20. There is one maternal aunt (out of 2) with breast cancer diagnosed at age 24, , and one paternal aunt (76) diagnosed with breast cancer at age 63. There is no history of ovarian cancer in the family.  GYNECOLOGIC HISTORY:  No LMP recorded. Patient has had a hysterectomy. Menarche age 89, first live birth age 74. The patient is GX P2. She underwent abdominal hysterectomy and subsequent bilateral salpingo-oophorectomy at age 69. She took hormone replacement for approximately 10 years.  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 Her husband [name] "Marguerite Olea" Foronda is retired from [employer]. It suggests that 2 of them at home, with no pets. Their daughter [name] is a Education officer, museum in [place], as is your second daughter, [name]. The patient has 4 grandchildren, including a granddaughter who is just entering medical school in [place], 2 grandsons at [employer], and a fourth is a Print production planner. The patient attends a local [name]     ADVANCED DIRECTIVES: Not in place   HEALTH MAINTENANCE: Social History   Tobacco Use  . Smoking status: Never Smoker  Substance Use Topics  . Alcohol use: No  . Drug use: No     Colonoscopy: Up to date/[name]  PAP:  Bone density:  Lipid panel:  Allergies  Allergen Reactions  . Aspirin Nausea Only    Gi upset    Current Outpatient  Medications  Medication Sig Dispense Refill  . ALPRAZolam (XANAX) 0.25 MG tablet Take 0.25 mg by mouth at bedtime as needed for anxiety.    . benazepril (LOTENSIN) 20 MG tablet Take 20 mg by mouth daily.    . butalbital-acetaminophen-caffeine (FIORICET, ESGIC) 50-325-40 MG per tablet TAKE 1 OR 2 TABLETS EVERY 6 HOURS AS NEEDED FOR SEVERE HEADACHE  4  . Cholecalciferol 1000 UNITS capsule Take 2,000 Units by mouth daily.     . citalopram (CELEXA) 10 MG tablet Take 1 tablet (10 mg total) by mouth daily. 30 tablet 3  . levothyroxine (SYNTHROID, LEVOTHROID) 75 MCG tablet Take 75 mcg by mouth daily before breakfast.    . metoprolol tartrate (LOPRESSOR) 25 MG tablet Take 25 mg by mouth 2 (two) times daily.  3  . Multiple Vitamin (MULTIVITAMIN) tablet Take 1 tablet by mouth daily.    . nitroGLYCERIN (NITROSTAT) 0.4 MG SL tablet  Place 0.4 mg under the tongue every 5 (five) minutes as needed for chest pain. Reported on 11/10/2015    . omeprazole (PRILOSEC) 20 MG capsule Take 20 mg by mouth daily as needed.     . pravastatin (PRAVACHOL) 40 MG tablet Take 40 mg by mouth daily.    . temazepam (RESTORIL) 15 MG capsule Take 15 mg by mouth at bedtime as needed for sleep.    . traMADol (ULTRAM) 50 MG tablet Take 1-2 tablets (50-100 mg total) by mouth every 12 (twelve) hours as needed. 120 tablet 0   No current facility-administered medications for this visit.     OBJECTIVE: Middle-aged white woman in no acute distress Vitals:   06/22/17 1335  BP: (!) 162/101  Pulse: 76  Resp: 17  Temp: 98.3 F (36.8 C)  SpO2: 99%     Body mass index is 22.64 kg/m.    ECOG FS:2 - Symptomatic, <50% confined to bed  Sclerae unicteric, pupils round and equal Oropharynx clear and moist No cervical or supraclavicular adenopathy Lungs no rales or rhonchi Heart regular rate and rhythm Abd soft, nontender, positive bowel sounds MSK no focal spinal tenderness, no upper extremity lymphedema Neuro: nonfocal, well oriented,  appropriate affect Breasts: The right breast is unremarkable.  The left breast is status post lumpectomy and radiation.  The previously noted erythema appears to be fading.  There is again noted alteration in the breast contour which is stable from prior.  Both axillae are benign.  Left breast, 09/11/2015    LAB RESULTS:  This is Dr. Jana Hakim  CMP     Component Value Date/Time   NA 134 (L) 03/15/2017 1103   K 4.5 03/15/2017 1103   CL 100 03/19/2014 1100   CO2 28 03/15/2017 1103   GLUCOSE 91 03/15/2017 1103   BUN 15.9 03/15/2017 1103   CREATININE 0.8 03/15/2017 1103   CALCIUM 9.8 03/15/2017 1103   PROT 7.0 03/15/2017 1103   ALBUMIN 4.1 03/15/2017 1103   AST 24 03/15/2017 1103   ALT 14 03/15/2017 1103   ALKPHOS 69 03/15/2017 1103   BILITOT 0.23 03/15/2017 1103   GFRNONAA 84 (L) 03/19/2014 1100   GFRAA >90 03/19/2014 1100    I No results found for: SPEP  Lab Results  Component Value Date   WBC 4.1 03/15/2017   NEUTROABS 2.4 03/15/2017   HGB 12.3 03/15/2017   HCT 36.3 03/15/2017   MCV 97.6 03/15/2017   PLT 252 03/15/2017      Chemistry      Component Value Date/Time   NA 134 (L) 03/15/2017 1103   K 4.5 03/15/2017 1103   CL 100 03/19/2014 1100   CO2 28 03/15/2017 1103   BUN 15.9 03/15/2017 1103   CREATININE 0.8 03/15/2017 1103      Component Value Date/Time   CALCIUM 9.8 03/15/2017 1103   ALKPHOS 69 03/15/2017 1103   AST 24 03/15/2017 1103   ALT 14 03/15/2017 1103   BILITOT 0.23 03/15/2017 1103       No results found for: LABCA2  No components found for: LABCA125  No results for input(s): INR in the last 168 hours.  Urinalysis No results found for: COLORURINE  STUDIES MRI of the abdomen was reviewed with the patient  ASSESSMENT: 76 y.o. [place] woman s/p left upper-outer quadrant biopsy 01/16/2014 for a clinical T1b N0, stage IA invasive ductal breast cancer  (1) status post left lumpectomy and sentinel lymph node sampling 03/21/2014 for a  pT1b pN0, stage IA  invasive ductal carcinoma, repeat HER-2 again negative.  (2) adjuvant radiation completed 06/17/2014 in [place]  (3) started tamoxifen 07/05/2014--stopped 12/25/2015 with the development of a left lower extremity clot  (a) status post remote TAH-BSO  (4) left adrenal adenoma and pancreatic cystic lesion noted on CT / angiogram of the chest 12/25/2015   (a) CT of the abdomen and pelvis obtained at [facility] 04/04/2017 stable  (b) MRI of the abdomen at [facility] 06/15/2017 stable  (5) left lower extremity duplex ultrasound 12/25/2015 showed occlusive thrombus in the posterior tibial and peroneal veins  (a) apixaban/Eliquis started 12/25/2015  (b) repeat Doppler ultrasonography 03/17/2016 reportedly negative--Apixaban stopped  (6) area of erythema lateral to the left areola biopsy July 2017 and 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  This is very favorable.  She continues to be very depressed.  She is not agreeable to antidepressants unfortunately.  She is using tramadol very sparingly and that does help her become a little bit more active especially in the evening so I was glad to refill that for her  Magrinat, Virgie Dad, MD  06/22/17 2:06 PM Medical Oncology and Hematology [facility] [address], [place] [address] Tel. [phone]    Fax. [phone]  This document serves as a record of services personally performed by Chauncey Cruel, MD. It was created on his behalf by Margit Banda, a trained medical scribe. The creation of this record is based on the scribe's personal observations and the provider's statements to them.  Since she does not tolerate MRIs we will do a CT of the abdomen and pelvis next August, shortly before  her September visit, to follow the adrenal and pancreatic lesions.  She will have her mammography in July as usual at the breast center.  She will see me again in September  She knows to call for any problems that may develop before that visit.   I have reviewed the above documentation for accuracy and completeness, and I agree with the above.

## 2017-06-22 ENCOUNTER — Telehealth: Payer: Self-pay | Admitting: Oncology

## 2017-06-22 ENCOUNTER — Ambulatory Visit (HOSPITAL_BASED_OUTPATIENT_CLINIC_OR_DEPARTMENT_OTHER): Payer: Medicare Other | Admitting: Oncology

## 2017-06-22 VITALS — BP 162/101 | HR 76 | Temp 98.3°F | Resp 17 | Ht 62.0 in | Wt 123.8 lb

## 2017-06-22 DIAGNOSIS — Z170 Estrogen receptor positive status [ER+]: Secondary | ICD-10-CM | POA: Diagnosis not present

## 2017-06-22 DIAGNOSIS — K862 Cyst of pancreas: Secondary | ICD-10-CM

## 2017-06-22 DIAGNOSIS — E279 Disorder of adrenal gland, unspecified: Secondary | ICD-10-CM

## 2017-06-22 DIAGNOSIS — F329 Major depressive disorder, single episode, unspecified: Secondary | ICD-10-CM | POA: Diagnosis not present

## 2017-06-22 DIAGNOSIS — C50412 Malignant neoplasm of upper-outer quadrant of left female breast: Secondary | ICD-10-CM

## 2017-06-22 DIAGNOSIS — D4410 Neoplasm of uncertain behavior of unspecified adrenal gland: Secondary | ICD-10-CM

## 2017-06-22 MED ORDER — TRAMADOL HCL 50 MG PO TABS: 50.0000 mg | ORAL_TABLET | Freq: Two times a day (BID) | ORAL | 0 refills | Status: DC | PRN

## 2017-06-22 NOTE — Telephone Encounter (Signed)
Gave patient AVS and calendar of upcoming September 2019 appointments.  °

## 2017-06-23 ENCOUNTER — Other Ambulatory Visit: Payer: Self-pay | Admitting: Oncology

## 2017-08-25 ENCOUNTER — Other Ambulatory Visit: Payer: Self-pay | Admitting: Oncology

## 2017-10-15 ENCOUNTER — Other Ambulatory Visit: Payer: Self-pay | Admitting: Oncology

## 2017-12-15 ENCOUNTER — Other Ambulatory Visit: Payer: Self-pay

## 2017-12-15 MED ORDER — TRAMADOL HCL 50 MG PO TABS: ORAL_TABLET | ORAL | 0 refills | Status: DC

## 2017-12-15 NOTE — Telephone Encounter (Signed)
Tramadol prescription called in to [facility] per pt request.

## 2017-12-26 ENCOUNTER — Other Ambulatory Visit: Payer: Self-pay

## 2017-12-26 ENCOUNTER — Other Ambulatory Visit: Payer: Self-pay | Admitting: Oncology

## 2017-12-26 ENCOUNTER — Telehealth: Payer: Self-pay | Admitting: *Deleted

## 2017-12-26 DIAGNOSIS — C50412 Malignant neoplasm of upper-outer quadrant of left female breast: Secondary | ICD-10-CM

## 2017-12-26 DIAGNOSIS — Z170 Estrogen receptor positive status [ER+]: Principal | ICD-10-CM

## 2017-12-26 DIAGNOSIS — N6320 Unspecified lump in the left breast, unspecified quadrant: Secondary | ICD-10-CM

## 2017-12-26 NOTE — Telephone Encounter (Signed)
Received call from pt stating that she had a mammogram scheduled for after 01/31/18 but thinks she needs an [place].  She is feeling a knot in her L breast & reports some soreness.  She would like Dr Jana Hakim to f/u.  She doesn't want to go to surgeon that she has seen.  Message to DR Magrinat/Pod RN

## 2018-01-25 ENCOUNTER — Telehealth: Payer: Self-pay

## 2018-01-25 NOTE — Telephone Encounter (Signed)
Called pt to return her vm regarding her upcoming CT scan. Pt would like to make sure that this is scheduled prior to her appt with Dr.Magrinat. Pt would like to have done at [facility] in [place]. Told pt that this RN will contact them and fax over a order requisition form.   Called [facility] and was able to schedule pt for CT abd/pelvis with contrast on Aug 12,2019 arrive at 1045am for labs and 1145am for scans.Pt must come in and pick up oral contrast, no later than 4:30pm, the day before scans. Pt needs to be 4 hrs npo for the scan. Faxed CT requisition form to [phone]  Attempted to call pt back x3 but unable to connect due to busy connection. Pt will need notified of appt for CT.

## 2018-02-01 ENCOUNTER — Ambulatory Visit
Admission: RE | Admit: 2018-02-01 | Discharge: 2018-02-01 | Disposition: A | Discharge status: Home / Self Care | Payer: Medicare Other | Source: Ambulatory Visit | Attending: Oncology | Admitting: Oncology

## 2018-02-01 DIAGNOSIS — C50412 Malignant neoplasm of upper-outer quadrant of left female breast: Secondary | ICD-10-CM

## 2018-02-01 DIAGNOSIS — N6320 Unspecified lump in the left breast, unspecified quadrant: Secondary | ICD-10-CM

## 2018-02-01 DIAGNOSIS — Z170 Estrogen receptor positive status [ER+]: Principal | ICD-10-CM

## 2018-02-15 ENCOUNTER — Other Ambulatory Visit: Payer: Self-pay

## 2018-02-15 MED ORDER — TRAMADOL HCL 50 MG PO TABS: ORAL_TABLET | ORAL | 0 refills | Status: DC

## 2018-02-21 ENCOUNTER — Other Ambulatory Visit: Payer: Self-pay | Admitting: Oncology

## 2018-03-13 NOTE — Progress Notes (Signed)
[place]  Telephone:([phone]) [phone] Fax:([phone]) [phone]     ID: Zoe Boone DOB: 04-30-1941  MR#: [id number]  ZVJ#:[id number]  Patient Care Team: Raina Mina., MD as PCP - General (Internal Medicine) Magrinat, Virgie Dad, MD as Consulting Physician (Oncology) Thea Silversmith, MD as Consulting Physician (Radiation Oncology) Richardo Priest, MD as Referring Physician (Cardiology) Fanny Skates, MD as Consulting Physician (General Surgery)  OTHER MD: Gatha Mayer M.D., Everlene Farrier M.D.  CHIEF COMPLAINT: estrogen receptor positive breast cancer  CURRENT TREATMENT: Observation  BREAST CANCER HISTORY: From the original intake note:  "Zoe Boone" had routine screening mammography in [facility] showing a possible mass in the upper left breast. Additional views and left breast ultrasonography confirmed a 7 mm mass in the upper-outer quadrant. This was biopsied under ultrasound guidance in [facility] 01/17/2014, and the pathology showed ([id number]) an invasive ductal carcinoma, E-cadherin positive, grade 1 or 2, estrogen receptor 99% positive, progesterone receptor 14% positive, with no amplification of HER-2, and with an MIB-1 of 17%.  On 02/19/2014 the patient underwent bilateral breast MRI. This showed a signal void in the superior left breast, but no abnormal enhancement, suggesting that most if not all of the original lesion was removed and biopsied.  The patient's subsequent history is as detailed below  INTERVAL HISTORY: Zoe Boone returns today for follow-up of her estrogen receptor positive breast cancer.  She continues under observation alone. She has pain that starts in her right hip that extends down to the knee. She also has a creeping and needle feeling in the lower right legs. She has had insomnia over the last 2 weeks. She has not been able to rest the right leg. She followed up with her PCP, and advised that she should have a doppler US of the right  leg on 03/21/2018. She takes tramadol for pain. She notes that she previously had a blood clot in her left leg.    Since her last visit, she underwent diagnostic bilateral mammography with CAD and tomography and left ultrasonography on 02/01/2018 at [place] showing: breast density category C. There was no mammographic or sonographic evidence of malignancy in either breast.  She also had a CT abdomen and pelvis on 02/13/2018 at [facility] 02/09/2018.  This is separately scanned.  The pancreatic tail lesion is a fluid density lesion measuring 1.9 cm, essentially unchanged from the 04/04/2017 measurements.  The scan also did show degenerative Solik subluxations in the lower lumbar spine with foraminal impingement at L4-5.   REVIEW OF SYSTEMS: Zoe Boone reports that she is not able to take OTC analgesics because they elevate her blood pressure. She has lost about 10 lbs since her last visit. She has had decreased appetite. She denies unusual headaches, visual changes, nausea, vomiting, or dizziness. There has been no unusual cough, phlegm production, or pleurisy. There has been no change in bowel or bladder habits. She denies unexplained fatigue or unexplained weight loss, bleeding, rash, or fever. A detailed review of systems was otherwise stable.    PAST MEDICAL HISTORY: Past Medical History:  Diagnosis Date  . Anxiety   . Arthritis   . Cancer [facility]) [id number]   left breast mammary carcinoma  . GERD (gastroesophageal reflux disease)   . Hyperlipidemia   . Hypertension   . Personal history of radiation therapy   . PONV (postoperative nausea and vomiting)   . Thyroid disease     PAST SURGICAL HISTORY: Past Surgical History:  Procedure Laterality Date  .  ABDOMINAL HYSTERECTOMY  1985  . BLADDER SUSPENSION  1992  . BREAST SURGERY     multiple br bx  . BUNIONECTOMY  1990   lt/rt  . CATARACT EXTRACTION     bilat.   . COLONOSCOPY  2014  . KNEE ARTHROSCOPY  2010   right     FAMILY  HISTORY Family History  Problem Relation Age of Onset  . Leukemia Father   . Diabetes Father   . Cancer Father        luekemia  . Skin cancer Mother   . Hypertension Mother   . Kidney cancer Brother   . Melanoma Brother    the patient's father died from complications of chronic leukemia at the age of 28. He had been diagnosed at age 65. The patient's mother died from "old age" at age 49. The patient has 3 brothers and 2 sisters. One brother was diagnosed with kidney cancer at the age of 36. There is one maternal aunt (out of 2) with breast cancer diagnosed at age 23, , and one paternal aunt (55) diagnosed with breast cancer at age 43. There is no history of ovarian cancer in the family.  GYNECOLOGIC HISTORY:  No LMP recorded. Patient has had a hysterectomy. Menarche age 68, first live birth age 28. The patient is GX P2. She underwent abdominal hysterectomy and subsequent bilateral salpingo-oophorectomy at age 78. She took hormone replacement for approximately 10 years.  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 Her husband [name] "Marguerite Olea" Wernli is retired from [employer]. It suggests that 2 of them at home, with no pets. Their daughter [name] is a Education officer, museum in [place], as is your second daughter, [name]. The patient has 4 grandchildren, including a granddaughter who is just entering medical school in [place], 2 grandsons at [employer], and a fourth is a Print production planner. The patient attends a local [name]     ADVANCED DIRECTIVES: Not in place   HEALTH MAINTENANCE: Social History   Tobacco Use  . Smoking status: Never Smoker  Substance Use Topics  . Alcohol use: No  . Drug use: No     Colonoscopy: Up to date/[name]  PAP:  Bone density:  Lipid panel:  Allergies  Allergen Reactions  . Aspirin Nausea Only    Gi upset    Current Outpatient Medications  Medication Sig  Dispense Refill  . ALPRAZolam (XANAX) 0.25 MG tablet Take 0.25 mg by mouth at bedtime as needed for anxiety.    . benazepril (LOTENSIN) 20 MG tablet Take 20 mg by mouth daily.    . butalbital-acetaminophen-caffeine (FIORICET, ESGIC) 50-325-40 MG per tablet TAKE 1 OR 2 TABLETS EVERY 6 HOURS AS NEEDED FOR SEVERE HEADACHE  4  . Cholecalciferol 1000 UNITS capsule Take 2,000 Units by mouth daily.     . citalopram (CELEXA) 10 MG tablet Take 1 tablet (10 mg total) by mouth daily. 30 tablet 3  . levothyroxine (SYNTHROID, LEVOTHROID) 75 MCG tablet Take 75 mcg by mouth daily before breakfast.    . metoprolol tartrate (LOPRESSOR) 25 MG tablet Take 25 mg by mouth 2 (two) times daily.  3  . Multiple Vitamin (MULTIVITAMIN) tablet Take 1 tablet by mouth daily.    . nitroGLYCERIN (NITROSTAT) 0.4 MG SL tablet Place 0.4 mg under the tongue every 5 (five) minutes as needed for chest pain. Reported on 11/10/2015    . omeprazole (  PRILOSEC) 20 MG capsule Take 20 mg by mouth daily as needed.     . pravastatin (PRAVACHOL) 40 MG tablet Take 40 mg by mouth daily.    . temazepam (RESTORIL) 15 MG capsule Take 15 mg by mouth at bedtime as needed for sleep.    . traMADol (ULTRAM) 50 MG tablet TAKE 1 TO 2 TABLETS BY MOUTH EVERY 12 HOURS AS NEEDED 120 tablet 0   No current facility-administered medications for this visit.     OBJECTIVE: Middle-aged white woman who appears depressed  Vitals:   03/14/18 1147  BP: (!) 204/101  Pulse: 74  Resp: 18  Temp: 98.1 F (36.7 C)  SpO2: 98%     Body mass index is 20.85 kg/m.    ECOG FS:2 - Symptomatic, <50% confined to bed  Sclerae unicteric, EOMs intact No cervical or supraclavicular adenopathy Lungs no rales or rhonchi Heart regular rate and rhythm Abd soft, nontender, positive bowel sounds MSK no focal spinal tenderness, minimal bilateral lower extremity lymphedema Neuro: nonfocal, well oriented, depressed affect Breasts: Right breast is benign.  The left breast is  scanned through lumpectomy and radiation.  The changes previously noted are stable.  There is no evidence of local recurrence.  Both axillae are benign.  Left breast, 09/11/2015    LAB RESULTS:  This is Dr. Jana Hakim  CMP     Component Value Date/Time   NA 140 03/14/2018 1127   NA 134 (L) 03/15/2017 1103   K 3.7 03/14/2018 1127   K 4.5 03/15/2017 1103   CL 102 03/14/2018 1127   CO2 29 03/14/2018 1127   CO2 28 03/15/2017 1103   GLUCOSE 117 (H) 03/14/2018 1127   GLUCOSE 91 03/15/2017 1103   BUN 18 03/14/2018 1127   BUN 15.9 03/15/2017 1103   CREATININE 0.94 03/14/2018 1127   CREATININE 0.8 03/15/2017 1103   CALCIUM 10.1 03/14/2018 1127   CALCIUM 9.8 03/15/2017 1103   PROT 7.1 03/14/2018 1127   PROT 7.0 03/15/2017 1103   ALBUMIN 4.3 03/14/2018 1127   ALBUMIN 4.1 03/15/2017 1103   AST 23 03/14/2018 1127   AST 24 03/15/2017 1103   ALT 17 03/14/2018 1127   ALT 14 03/15/2017 1103   ALKPHOS 71 03/14/2018 1127   ALKPHOS 69 03/15/2017 1103   BILITOT 0.4 03/14/2018 1127   BILITOT 0.23 03/15/2017 1103   GFRNONAA 58 (L) 03/14/2018 1127   GFRAA >60 03/14/2018 1127    I No results found for: SPEP  Lab Results  Component Value Date   WBC 4.6 03/14/2018   NEUTROABS 3.2 03/14/2018   HGB 12.3 03/14/2018   HCT 37.0 03/14/2018   MCV 96.4 03/14/2018   PLT 247 03/14/2018      Chemistry      Component Value Date/Time   NA 140 03/14/2018 1127   NA 134 (L) 03/15/2017 1103   K 3.7 03/14/2018 1127   K 4.5 03/15/2017 1103   CL 102 03/14/2018 1127   CO2 29 03/14/2018 1127   CO2 28 03/15/2017 1103   BUN 18 03/14/2018 1127   BUN 15.9 03/15/2017 1103   CREATININE 0.94 03/14/2018 1127   CREATININE 0.8 03/15/2017 1103      Component Value Date/Time   CALCIUM 10.1 03/14/2018 1127   CALCIUM 9.8 03/15/2017 1103   ALKPHOS 71 03/14/2018 1127   ALKPHOS 69 03/15/2017 1103   AST 23 03/14/2018 1127   AST 24 03/15/2017 1103   ALT 17 03/14/2018 1127   ALT 14 03/15/2017 1103  BILITOT 0.4 03/14/2018 1127   BILITOT 0.23 03/15/2017 1103       No results found for: LABCA2  No components found for: LABCA125  No results for input(s): INR in the last 168 hours.  Urinalysis No results found for: [place] Since her last visit, she underwent diagnostic bilateral mammography with CAD and tomography and left ultrasonography on 01/31/2018 at [place] showing: breast density category C. There was no mammographic or sonographic evidence of malignancy in either breast.  Her CT scan of the abdomen and pelvis at [facility] 02/10/2019 ninth teen is discussed above   ASSESSMENT: 77 y.o. [place] woman s/p left upper-outer quadrant biopsy 01/16/2014 for a clinical T1b N0, stage IA invasive ductal breast cancer  (1) status post left lumpectomy and sentinel lymph node sampling 03/21/2014 for a pT1b pN0, stage IA invasive ductal carcinoma, repeat HER-2 again negative.  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 noted on CT / angiogram of the chest 12/25/2015   (a) CT of the abdomen and pelvis obtained at [facility] 04/04/2017 stable  (b) MRI of the abdomen at [facility] 06/15/2017 stable  (5) left lower extremity duplex ultrasound 12/25/2015 showed occlusive thrombus in the posterior tibial and peroneal veins  (a) apixaban/Eliquis started 12/25/2015  (b) repeat Doppler ultrasonography 03/17/2016 reportedly negative--Apixaban stopped  (6) area of erythema lateral to the left areola biopsy July 2017 and 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  It is not just the pain although she does attributed to the pain.  She  has also lost 10 pounds, is not sleeping, and has clearly depressive affect.  This is not entirely new but it is certainly worse.  I think she would benefit from mirtazapine on all those counts.  I discussed the drug with her and she has a good understanding of the possible toxicities, side effects and complications.  She agrees to started and I have gone ahead and placed the prescription in for her.  She also requested a refill on her tramadol which I was glad to do for her  I think as far as her leg pain is concerned she has L4-5 foraminal impingement as noted on the recent CT scan from [facility] 02/09/2018.  I suggested she contact an orthopedist in [place] but she tells me she has already met with [facility] orthopedics and she would like a referral there which I was glad to provide for her.  Incidentally the pancreatic cyst we have been following is entirely stable  She will see me again in 6 months.  She knows to call for any other issues that may develop before the next visit.   Magrinat, Virgie Dad, MD  03/14/18 12:15 PM Medical Oncology and Hematology [facility] [address] [place], [place] [address] Tel. [phone]    Fax. [phone]  Alice Rieger, am acting as scribe for Chauncey Cruel MD. I, Lurline Del MD, have reviewed the above documentation for accuracy and completeness, and I agree with the above.

## 2018-03-14 ENCOUNTER — Inpatient Hospital Stay: Payer: Medicare Other | Attending: Oncology | Admitting: Oncology

## 2018-03-14 ENCOUNTER — Inpatient Hospital Stay: Payer: Medicare Other

## 2018-03-14 ENCOUNTER — Encounter: Payer: Self-pay | Admitting: Oncology

## 2018-03-14 ENCOUNTER — Telehealth: Payer: Self-pay | Admitting: Oncology

## 2018-03-14 VITALS — BP 204/101 | HR 74 | Temp 98.1°F | Resp 18 | Ht 62.0 in | Wt 114.0 lb

## 2018-03-14 DIAGNOSIS — R634 Abnormal weight loss: Secondary | ICD-10-CM | POA: Diagnosis not present

## 2018-03-14 DIAGNOSIS — Z90722 Acquired absence of ovaries, bilateral: Secondary | ICD-10-CM | POA: Diagnosis not present

## 2018-03-14 DIAGNOSIS — R630 Anorexia: Secondary | ICD-10-CM

## 2018-03-14 DIAGNOSIS — Z8051 Family history of malignant neoplasm of kidney: Secondary | ICD-10-CM

## 2018-03-14 DIAGNOSIS — G4700 Insomnia, unspecified: Secondary | ICD-10-CM | POA: Diagnosis not present

## 2018-03-14 DIAGNOSIS — K862 Cyst of pancreas: Secondary | ICD-10-CM | POA: Diagnosis not present

## 2018-03-14 DIAGNOSIS — K219 Gastro-esophageal reflux disease without esophagitis: Secondary | ICD-10-CM

## 2018-03-14 DIAGNOSIS — Z923 Personal history of irradiation: Secondary | ICD-10-CM

## 2018-03-14 DIAGNOSIS — E785 Hyperlipidemia, unspecified: Secondary | ICD-10-CM

## 2018-03-14 DIAGNOSIS — M25551 Pain in right hip: Secondary | ICD-10-CM

## 2018-03-14 DIAGNOSIS — D3502 Benign neoplasm of left adrenal gland: Secondary | ICD-10-CM

## 2018-03-14 DIAGNOSIS — I10 Essential (primary) hypertension: Secondary | ICD-10-CM | POA: Diagnosis not present

## 2018-03-14 DIAGNOSIS — Z806 Family history of leukemia: Secondary | ICD-10-CM | POA: Diagnosis not present

## 2018-03-14 DIAGNOSIS — F329 Major depressive disorder, single episode, unspecified: Secondary | ICD-10-CM

## 2018-03-14 DIAGNOSIS — C50412 Malignant neoplasm of upper-outer quadrant of left female breast: Secondary | ICD-10-CM | POA: Diagnosis not present

## 2018-03-14 DIAGNOSIS — E079 Disorder of thyroid, unspecified: Secondary | ICD-10-CM | POA: Diagnosis not present

## 2018-03-14 DIAGNOSIS — Z9223 Personal history of estrogen therapy: Secondary | ICD-10-CM | POA: Insufficient documentation

## 2018-03-14 DIAGNOSIS — Z90710 Acquired absence of both cervix and uterus: Secondary | ICD-10-CM

## 2018-03-14 DIAGNOSIS — Z79899 Other long term (current) drug therapy: Secondary | ICD-10-CM | POA: Diagnosis not present

## 2018-03-14 DIAGNOSIS — Z170 Estrogen receptor positive status [ER+]: Secondary | ICD-10-CM | POA: Diagnosis not present

## 2018-03-14 LAB — COMPREHENSIVE METABOLIC PANEL
ALK PHOS: 71 U/L (ref 38–126)
ALT: 17 U/L (ref 0–44)
ANION GAP: 9 (ref 5–15)
AST: 23 U/L (ref 15–41)
Albumin: 4.3 g/dL (ref 3.5–5.0)
BILIRUBIN TOTAL: 0.4 mg/dL (ref 0.3–1.2)
BUN: 18 mg/dL (ref 8–23)
CALCIUM: 10.1 mg/dL (ref 8.9–10.3)
CO2: 29 mmol/L (ref 22–32)
CREATININE: 0.94 mg/dL (ref 0.44–1.00)
Chloride: 102 mmol/L (ref 98–111)
GFR, EST NON AFRICAN AMERICAN: 58 mL/min — AB (ref >60)
Glucose, Bld: 117 mg/dL — ABNORMAL HIGH (ref 70–99)
Potassium: 3.7 mmol/L (ref 3.5–5.1)
Sodium: 140 mmol/L (ref 135–145)
TOTAL PROTEIN: 7.1 g/dL (ref 6.5–8.1)

## 2018-03-14 LAB — CBC WITH DIFFERENTIAL/PLATELET
BASOS ABS: 0 10*3/uL (ref 0.0–0.1)
BASOS PCT: 1 %
EOS ABS: 0.1 10*3/uL (ref 0.0–0.5)
Eosinophils Relative: 1 %
HEMATOCRIT: 37 % (ref 34.8–46.6)
HEMOGLOBIN: 12.3 g/dL (ref 11.6–15.9)
Lymphocytes Relative: 20 %
Lymphs Abs: 0.9 10*3/uL (ref 0.9–3.3)
MCH: 32 pg (ref 25.1–34.0)
MCHC: 33.2 g/dL (ref 31.5–36.0)
MCV: 96.4 fL (ref 79.5–101.0)
Monocytes Absolute: 0.4 10*3/uL (ref 0.1–0.9)
Monocytes Relative: 8 %
NEUTROS ABS: 3.2 10*3/uL (ref 1.5–6.5)
NEUTROS PCT: 70 %
Platelets: 247 10*3/uL (ref 145–400)
RBC: 3.84 MIL/uL (ref 3.70–5.45)
RDW: 12.9 % (ref 11.2–14.5)
WBC: 4.6 10*3/uL (ref 3.9–10.3)

## 2018-03-14 MED ORDER — MIRTAZAPINE 15 MG PO TABS: 15.0000 mg | ORAL_TABLET | Freq: Every day | ORAL | 6 refills | Status: DC

## 2018-03-14 MED ORDER — TRAMADOL HCL 50 MG PO TABS: ORAL_TABLET | ORAL | 0 refills | Status: AC

## 2018-03-14 NOTE — Telephone Encounter (Signed)
Gave patient 2 avs reports per her request.  Gave patient calendar.  Sent [facility] message regarding Ortho referral needed.

## 2018-03-15 ENCOUNTER — Telehealth: Payer: Self-pay | Admitting: Oncology

## 2018-03-15 NOTE — Telephone Encounter (Signed)
Put referral for ortho, Dr. Suella Broad, into [facility] system.

## 2018-04-05 ENCOUNTER — Other Ambulatory Visit: Payer: Self-pay | Admitting: Oncology

## 2018-07-08 ENCOUNTER — Other Ambulatory Visit: Payer: Self-pay | Admitting: Oncology

## 2018-08-21 ENCOUNTER — Other Ambulatory Visit: Payer: Self-pay | Admitting: *Deleted

## 2018-08-23 ENCOUNTER — Telehealth: Payer: Self-pay | Admitting: Oncology

## 2018-08-23 ENCOUNTER — Telehealth: Payer: Self-pay

## 2018-08-23 NOTE — Telephone Encounter (Signed)
Nurse spoke with patient regarding follow up imaging.  Nurse reviewed previous imaging and physician notes, no indications for recommendations of re-scanning.  Pt informed this would be reviewed with MD for recommendations.  Voiced understanding.

## 2018-08-23 NOTE — Telephone Encounter (Signed)
[facility] CME 3/10 - moved appointments from 3/10 to 3/30. Spoke with patient. Patient had questions re the need to repeat a scan and call was transferred to the desk nurse.

## 2018-08-24 ENCOUNTER — Other Ambulatory Visit: Payer: Self-pay | Admitting: *Deleted

## 2018-09-08 ENCOUNTER — Other Ambulatory Visit: Payer: Self-pay | Admitting: *Deleted

## 2018-09-12 ENCOUNTER — Ambulatory Visit: Payer: Medicare Other | Admitting: Oncology

## 2018-09-12 ENCOUNTER — Other Ambulatory Visit: Payer: Medicare Other

## 2018-09-25 ENCOUNTER — Telehealth: Payer: Self-pay | Admitting: Oncology

## 2018-09-25 NOTE — Telephone Encounter (Signed)
Called regarding 7/13 °

## 2018-10-02 ENCOUNTER — Other Ambulatory Visit: Payer: Medicare Other

## 2018-10-02 ENCOUNTER — Ambulatory Visit: Payer: Medicare Other | Admitting: Oncology

## 2018-10-20 ENCOUNTER — Telehealth: Payer: Self-pay | Admitting: *Deleted

## 2018-10-20 ENCOUNTER — Other Ambulatory Visit: Payer: Self-pay | Admitting: Oncology

## 2018-10-20 DIAGNOSIS — N63 Unspecified lump in unspecified breast: Secondary | ICD-10-CM

## 2018-10-20 DIAGNOSIS — C50412 Malignant neoplasm of upper-outer quadrant of left female breast: Secondary | ICD-10-CM

## 2018-10-20 DIAGNOSIS — Z170 Estrogen receptor positive status [ER+]: Principal | ICD-10-CM

## 2018-10-20 NOTE — Telephone Encounter (Signed)
Notified patient that Dr Jana Hakim has ordered an [place] and mammogram at [facility]

## 2018-10-20 NOTE — Telephone Encounter (Signed)
Mrs Motsinger left a message stating she has another lump beside the one she originally had. "I am very concerned, I am having a lot of pain and it is below the area that is scar tissue". Has mammogram in July, but did not have any additional testing as it has been 5 years. Is requesting to have mammogram in [place] at [facility] due to the Covid situation

## 2018-11-09 ENCOUNTER — Other Ambulatory Visit: Payer: Self-pay | Admitting: Oncology

## 2018-11-09 ENCOUNTER — Encounter: Payer: Self-pay | Admitting: Oncology

## 2018-12-21 IMAGING — MG DIGITAL DIAGNOSTIC BILATERAL MAMMOGRAM WITH TOMO AND CAD
6 of 11 series · 6 of 31 positions shown · non-contrast
Comparison: Mammography 01/31/2017, 01/30/2016 and earlier.

CLINICAL DATA: 77-year-old who underwent malignant lumpectomy of
the UPPER OUTER QUADRANT of the LEFT breast in 9622, now with
possible palpable lumps at the lumpectomy site in the LEFT
breast.Annual evaluation, RIGHT breast.

EXAM:
DIGITAL DIAGNOSTIC BILATERAL MAMMOGRAM WITH CAD AND TOMO
ULTRASOUND LEFT BREAST

[L MLO]
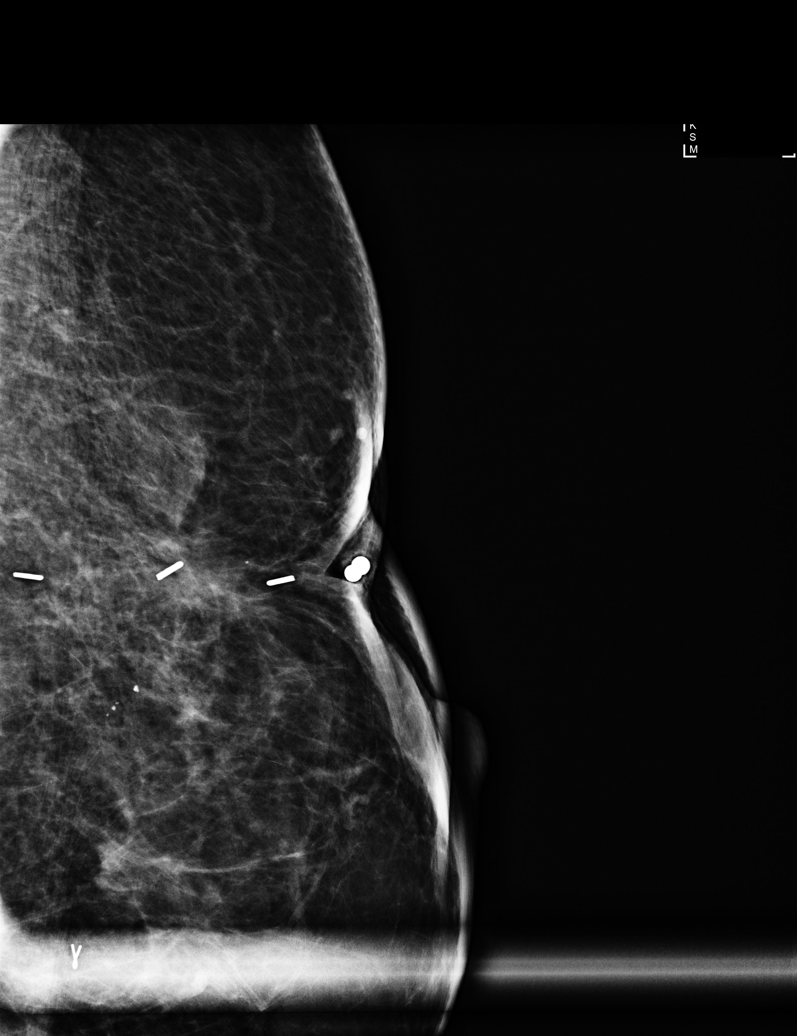

[R MLO synth-2D]
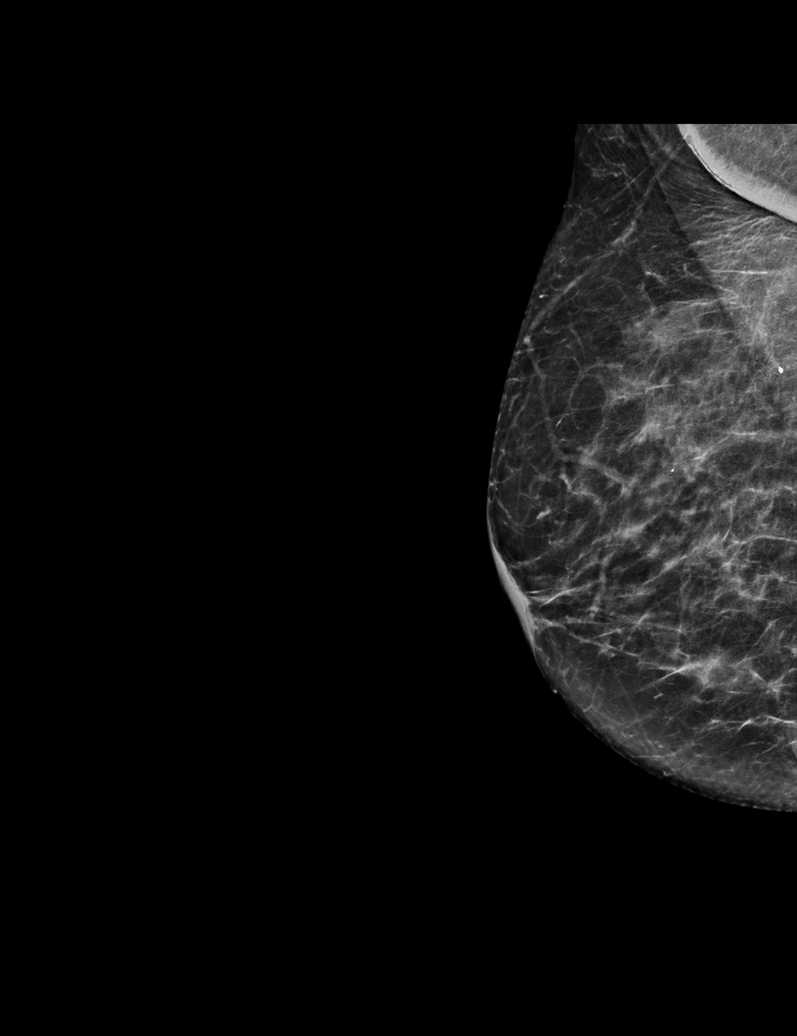

[L MLO synth-2D (1 of 2)]
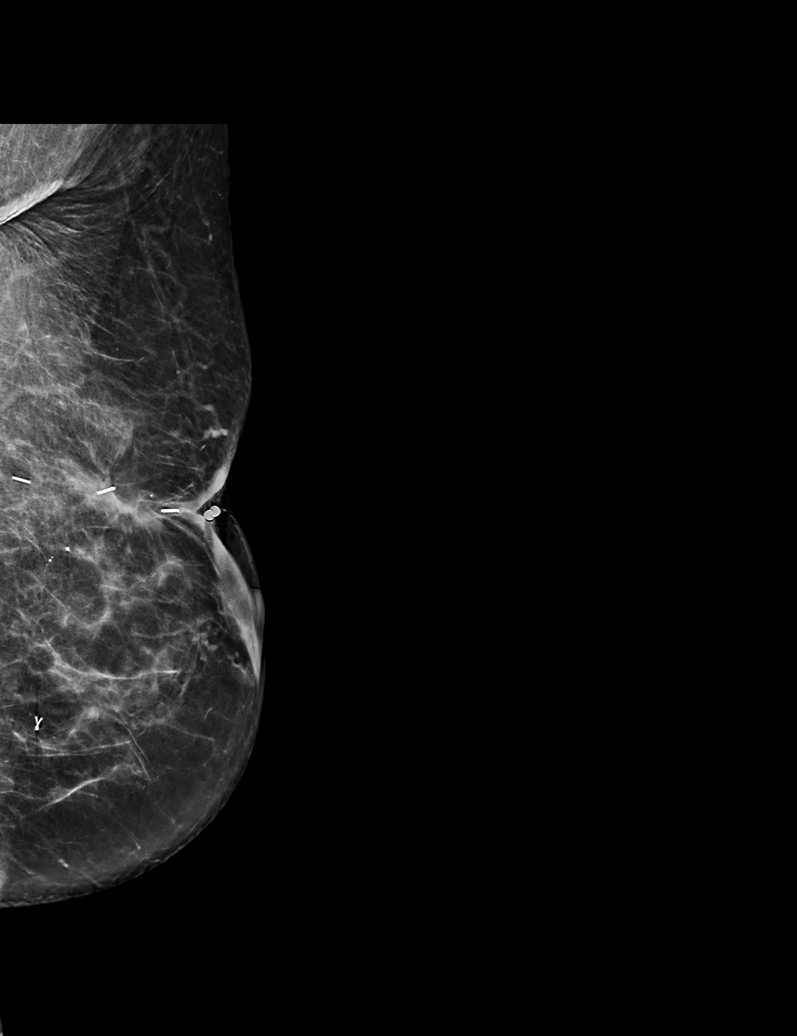

[L MLO synth-2D (2 of 2)]
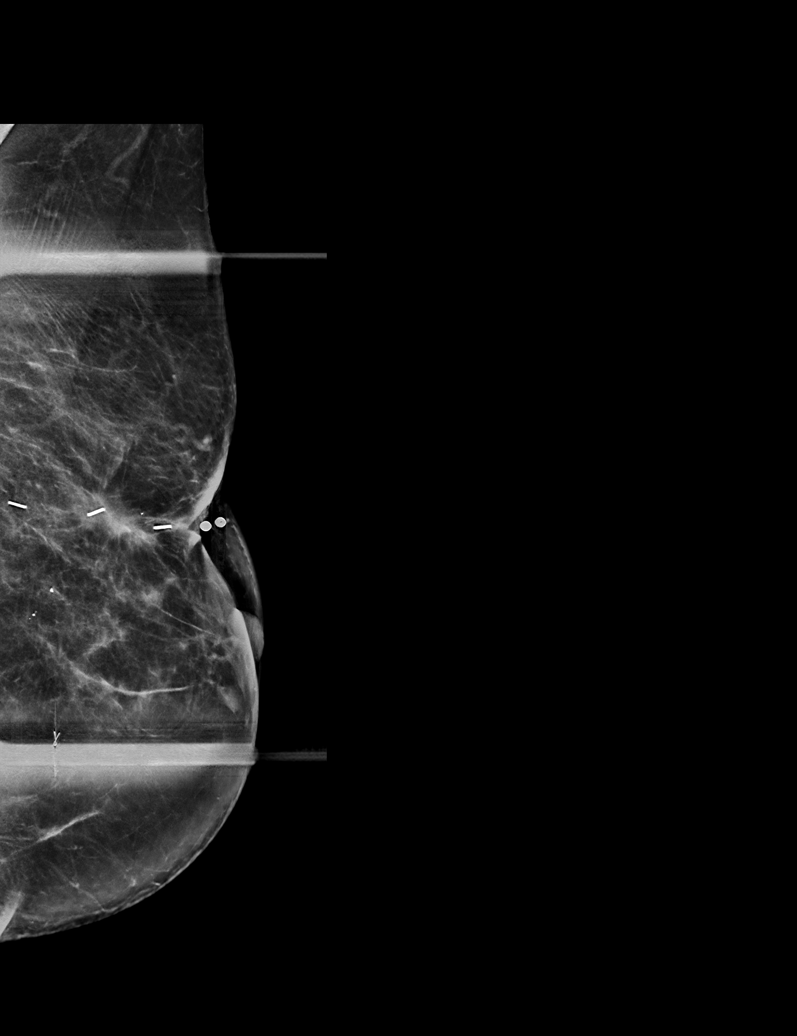

[L CC synth-2D]
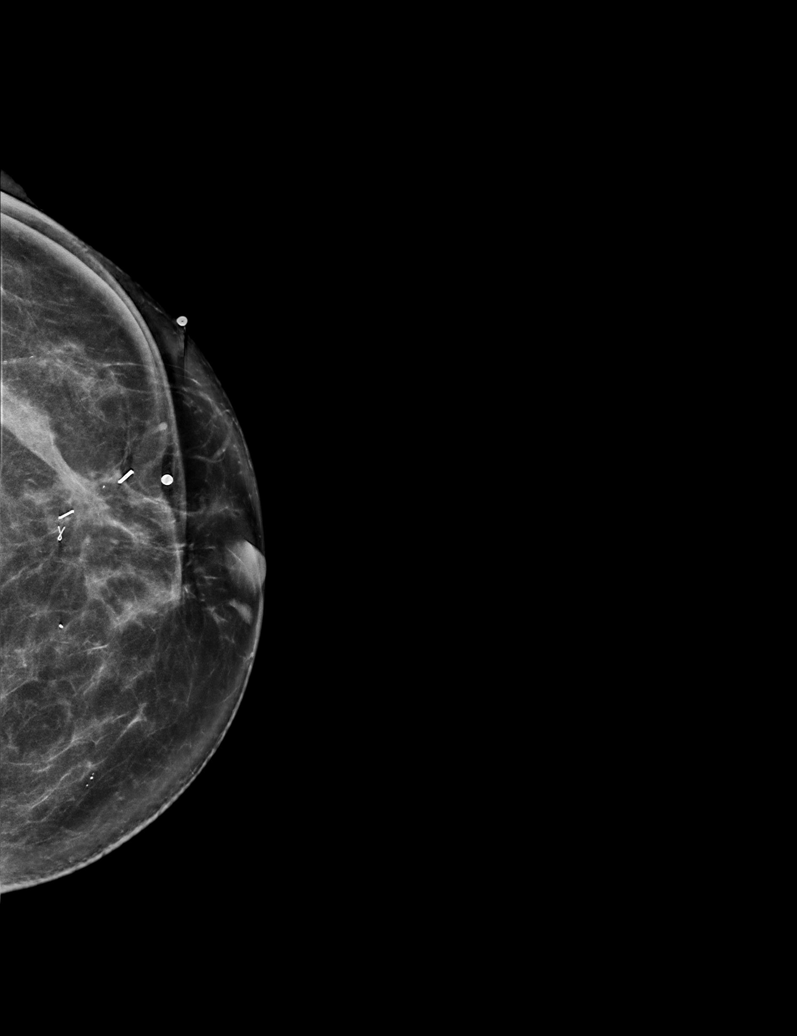

[R CC synth-2D]
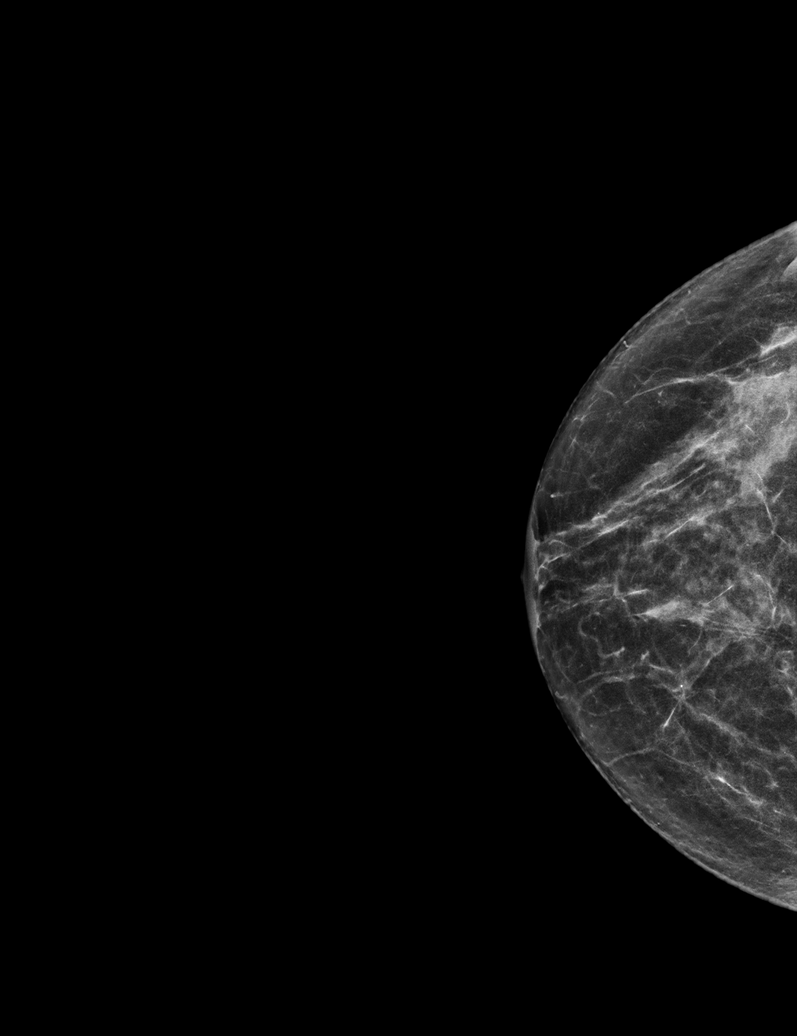

[6 of 31 positions shown; findings below may reference images not displayed]

LEFT
breast ultrasound 01/31/2017, 11/28/2015 and earlier.

ACR Breast Density Category c: The breast tissue is heterogeneously
dense, which may obscure small masses.
FINDINGS: Tomosynthesis and synthesized full field CC and MLO views of both
breasts were obtained. Tomosynthesis and synthesized spot
compression tangential view of the area of concern in the LEFT
breast and a standard spot magnification MLO view of the lumpectomy
site in the LEFT breast were also obtained.

Post surgical scar/architectural distortion at the lumpectomy site
in the UPPER OUTER LEFT breast at ANTERIOR to POSTERIOR depth. There
are associated benign oil cysts at the lumpectomy site. The
appearance of the lumpectomy site is unchanged. No new or suspicious
findings in the LEFT breast.

No findings suspicious for malignancy in the RIGHT breast.

Mammographic images were processed with CAD.

On physical exam, there is a less than BB sized palpable lump
involving the MEDIAL aspect of the surgical scar in the OUTER LEFT
breast corresponding to what the patient is feeling. Skin erythema
is present at the LATERAL margin of the scar in the second area of
patient concern.

Targeted LEFT breast ultrasound is performed, showing shadowing and
architectural distortion related to the scar involving the UPPER
OUTER periareolar location at the 2 o'clock position approximately 2
cm from the nipple. At the site of the palpable lump involving the
MEDIAL aspect of the scar, one of the surgical clips is present
immediately beneath the skin and may account for the palpable
concern. At the LATERAL margin of the scar, there is mild skin
thickening, though there is no evidence of an underlying mass or
abscess. Mild hyperemia is present in this location on power Doppler
evaluation.
IMPRESSION: 1. No mammographic or sonographic evidence of malignancy involving
the LEFT breast.
2. Expected post lumpectomy changes involving the LEFT breast.
3. Focal inflammation involving the skin of the LATERAL aspect of
the lumpectomy scar in the OUTER LEFT breast.
4. No mammographic evidence of malignancy involving the RIGHT
breast.

RECOMMENDATION:
As the patient is now 5 years post lumpectomy, she may return to
annual screening mammography. Screening mammogram in one year is
recommended.(Code:WZ-[id number])

I have discussed the findings and recommendations with the patient.
Results were also provided in writing at the conclusion of the
visit. If applicable, a reminder letter will be sent to the patient
regarding the next appointment.

BI-RADS CATEGORY  2: Benign.

## 2019-01-15 ENCOUNTER — Inpatient Hospital Stay (HOSPITAL_BASED_OUTPATIENT_CLINIC_OR_DEPARTMENT_OTHER): Payer: Medicare Other | Admitting: Oncology

## 2019-01-15 ENCOUNTER — Inpatient Hospital Stay: Payer: Medicare Other | Attending: Oncology

## 2019-01-15 ENCOUNTER — Other Ambulatory Visit: Payer: Self-pay | Admitting: *Deleted

## 2019-01-15 ENCOUNTER — Other Ambulatory Visit: Payer: Self-pay

## 2019-01-15 VITALS — BP 184/84 | HR 61 | Temp 99.1°F | Resp 18 | Wt 109.0 lb

## 2019-01-15 DIAGNOSIS — Q619 Cystic kidney disease, unspecified: Secondary | ICD-10-CM

## 2019-01-15 DIAGNOSIS — Z86718 Personal history of other venous thrombosis and embolism: Secondary | ICD-10-CM

## 2019-01-15 DIAGNOSIS — M545 Low back pain: Secondary | ICD-10-CM

## 2019-01-15 DIAGNOSIS — E785 Hyperlipidemia, unspecified: Secondary | ICD-10-CM

## 2019-01-15 DIAGNOSIS — Z9223 Personal history of estrogen therapy: Secondary | ICD-10-CM

## 2019-01-15 DIAGNOSIS — R634 Abnormal weight loss: Secondary | ICD-10-CM | POA: Diagnosis not present

## 2019-01-15 DIAGNOSIS — Z923 Personal history of irradiation: Secondary | ICD-10-CM | POA: Diagnosis not present

## 2019-01-15 DIAGNOSIS — N3000 Acute cystitis without hematuria: Secondary | ICD-10-CM

## 2019-01-15 DIAGNOSIS — Z90710 Acquired absence of both cervix and uterus: Secondary | ICD-10-CM | POA: Insufficient documentation

## 2019-01-15 DIAGNOSIS — Z170 Estrogen receptor positive status [ER+]: Secondary | ICD-10-CM | POA: Diagnosis not present

## 2019-01-15 DIAGNOSIS — Z79899 Other long term (current) drug therapy: Secondary | ICD-10-CM | POA: Insufficient documentation

## 2019-01-15 DIAGNOSIS — K219 Gastro-esophageal reflux disease without esophagitis: Secondary | ICD-10-CM

## 2019-01-15 DIAGNOSIS — D3500 Benign neoplasm of unspecified adrenal gland: Secondary | ICD-10-CM

## 2019-01-15 DIAGNOSIS — I10 Essential (primary) hypertension: Secondary | ICD-10-CM | POA: Insufficient documentation

## 2019-01-15 DIAGNOSIS — Z90722 Acquired absence of ovaries, bilateral: Secondary | ICD-10-CM | POA: Diagnosis not present

## 2019-01-15 DIAGNOSIS — M1990 Unspecified osteoarthritis, unspecified site: Secondary | ICD-10-CM

## 2019-01-15 DIAGNOSIS — G8929 Other chronic pain: Secondary | ICD-10-CM | POA: Diagnosis not present

## 2019-01-15 DIAGNOSIS — Z853 Personal history of malignant neoplasm of breast: Secondary | ICD-10-CM | POA: Diagnosis present

## 2019-01-15 DIAGNOSIS — C50412 Malignant neoplasm of upper-outer quadrant of left female breast: Secondary | ICD-10-CM

## 2019-01-15 DIAGNOSIS — F329 Major depressive disorder, single episode, unspecified: Secondary | ICD-10-CM

## 2019-01-15 LAB — COMPREHENSIVE METABOLIC PANEL
ALT: 20 U/L (ref 0–44)
AST: 28 U/L (ref 15–41)
Albumin: 4.3 g/dL (ref 3.5–5.0)
Alkaline Phosphatase: 47 U/L (ref 38–126)
Anion gap: 7 (ref 5–15)
BUN: 22 mg/dL (ref 8–23)
CO2: 29 mmol/L (ref 22–32)
Calcium: 9.5 mg/dL (ref 8.9–10.3)
Chloride: 103 mmol/L (ref 98–111)
Creatinine, Ser: 0.85 mg/dL (ref 0.44–1.00)
GFR calc Af Amer: >60 mL/min (ref >60)
GFR calc non Af Amer: >60 mL/min (ref >60)
Glucose, Bld: 98 mg/dL (ref 70–99)
Potassium: 4.3 mmol/L (ref 3.5–5.1)
Sodium: 139 mmol/L (ref 135–145)
Total Bilirubin: <0.2 mg/dL — ABNORMAL LOW (ref 0.3–1.2)
Total Protein: 7.2 g/dL (ref 6.5–8.1)

## 2019-01-15 LAB — URINALYSIS, COMPLETE (UACMP) WITH MICROSCOPIC
Bilirubin Urine: NEGATIVE
Glucose, UA: NEGATIVE mg/dL
Hgb urine dipstick: NEGATIVE
Ketones, ur: NEGATIVE mg/dL
Nitrite: NEGATIVE
Protein, ur: NEGATIVE mg/dL
Specific Gravity, Urine: 1.018 (ref 1.005–1.030)
WBC, UA: >50 WBC/hpf — ABNORMAL HIGH (ref 0–5)
pH: 5 (ref 5.0–8.0)

## 2019-01-15 LAB — CBC WITH DIFFERENTIAL/PLATELET
Abs Immature Granulocytes: 0.02 10*3/uL (ref 0.00–0.07)
Basophils Absolute: 0 10*3/uL (ref 0.0–0.1)
Basophils Relative: 1 %
Eosinophils Absolute: 0.1 10*3/uL (ref 0.0–0.5)
Eosinophils Relative: 2 %
HCT: 37.1 % (ref 36.0–46.0)
Hemoglobin: 11.9 g/dL — ABNORMAL LOW (ref 12.0–15.0)
Immature Granulocytes: 0 %
Lymphocytes Relative: 29 %
Lymphs Abs: 1.7 10*3/uL (ref 0.7–4.0)
MCH: 32.2 pg (ref 26.0–34.0)
MCHC: 32.1 g/dL (ref 30.0–36.0)
MCV: 100.3 fL — ABNORMAL HIGH (ref 80.0–100.0)
Monocytes Absolute: 0.5 10*3/uL (ref 0.1–1.0)
Monocytes Relative: 8 %
Neutro Abs: 3.5 10*3/uL (ref 1.7–7.7)
Neutrophils Relative %: 60 %
Platelets: 304 10*3/uL (ref 150–400)
RBC: 3.7 MIL/uL — ABNORMAL LOW (ref 3.87–5.11)
RDW: 12.7 % (ref 11.5–15.5)
WBC: 5.8 10*3/uL (ref 4.0–10.5)
nRBC: 0 % (ref 0.0–0.2)

## 2019-01-15 NOTE — Progress Notes (Signed)
[place]  Telephone:([phone]) [phone] Fax:([phone]) [phone]     ID: [name] DOB: 07-10-1940  MR#: [id number]  TUU#:[id number]  Patient Care Team: Raina Mina., MD as PCP - General (Internal Medicine) Kaj Vasil, Virgie Dad, MD as Consulting Physician (Oncology) Thea Silversmith, MD as Consulting Physician (Radiation Oncology) Richardo Priest, MD as Referring Physician (Cardiology) Fanny Skates, MD as Consulting Physician (General Surgery)  OTHER MD: Gatha Mayer M.D., Everlene Farrier M.D.  CHIEF COMPLAINT: estrogen receptor positive breast cancer  CURRENT TREATMENT: Observation   INTERVAL HISTORY: Zoe Boone returns today for follow-up of her estrogen receptor positive breast cancer.  She continues under observation alone.   Since her last visit, she called our office on 10/20/2018 concerning a new lump with associated pain. She underwent left diagnostic mammography with tomography and left breast ultrasound at [facility] on 10/30/2018 showing: breast density category C; stable left lumpectomy changes containing scarring and fat necrosis; no new suspicious masses, calcifications, or distortion in the left breast.   REVIEW OF SYSTEMS: Zoe Boone reports pain in her back and down her left leg. She states she received several shot in her back under Dr. Maryjean Ka, but she reports no relief. She continues to lose weight. She reports dysuria. A detailed review of systems was otherwise stable.  Wt Readings from Last 3 Encounters:  01/15/19 109 lb (49.4 kg)  03/14/18 114 lb (51.7 kg)  06/22/17 123 lb 12.8 oz (56.2 kg)      BREAST CANCER HISTORY: From the original intake note:  "Zoe Boone" had routine screening mammography in [facility] showing a possible mass in the upper left breast. Additional views and left breast ultrasonography confirmed a 7 mm mass in the upper-outer quadrant. This was biopsied under ultrasound guidance in [facility] 01/17/2014, and the  pathology showed ([id number]) an invasive ductal carcinoma, E-cadherin positive, grade 1 or 2, estrogen receptor 99% positive, progesterone receptor 14% positive, with no amplification of HER-2, and with an MIB-1 of 17%.  On 02/19/2014 the patient underwent bilateral breast MRI. This showed a signal void in the superior left breast, but no abnormal enhancement, suggesting that most if not all of the original lesion was removed and biopsied.  The patient's subsequent history is as detailed below   PAST MEDICAL HISTORY: Past Medical History:  Diagnosis Date  . Anxiety   . Arthritis   . Cancer [facility]) [id number]   left breast mammary carcinoma  . GERD (gastroesophageal reflux disease)   . Hyperlipidemia   . Hypertension   . Personal history of radiation therapy   . PONV (postoperative nausea and vomiting)   . Thyroid disease     PAST SURGICAL HISTORY: Past Surgical History:  Procedure Laterality Date  . ABDOMINAL HYSTERECTOMY  1985  . BLADDER SUSPENSION  1992  . BREAST SURGERY     multiple br bx  . BUNIONECTOMY  1990   lt/rt  . CATARACT EXTRACTION     bilat.   . COLONOSCOPY  2014  . KNEE ARTHROSCOPY  2010   right     FAMILY HISTORY Family History  Problem Relation Age of Onset  . Leukemia Father   . Diabetes Father   . Cancer Father        luekemia  . Skin cancer Mother   . Hypertension Mother   . Kidney cancer Brother   . Melanoma Brother    the patient's father died from complications of chronic leukemia at the age of 12. He had been diagnosed at  age 63. The patient's mother died from "old age" at age 63. The patient has 3 brothers and 2 sisters. One brother was diagnosed with kidney cancer at the age of 47. There is one maternal aunt (out of 2) with breast cancer diagnosed at age 92, , and one paternal aunt (45) diagnosed with breast cancer at age 51. There is no history of ovarian cancer in the family.   GYNECOLOGIC HISTORY:  No LMP recorded. Patient has had a  hysterectomy. Menarche age 44, first live birth age 56. The patient is GX P2. She underwent abdominal hysterectomy and subsequent bilateral salpingo-oophorectomy at age 32. She took hormone replacement for approximately 10 years.   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 Her husband [name] "Marguerite Olea" Calamari is retired from [employer]. It suggests that 2 of them at home, with no pets. Their daughter [name] is a Education officer, museum in [place], as is your second daughter, [name]. The patient has 4 grandchildren, including a granddaughter who is just entering medical school in [place], 2 grandsons at [employer], and a fourth is a Print production planner. The patient attends a local [name]    ADVANCED DIRECTIVES: Not in place   HEALTH MAINTENANCE: Social History   Tobacco Use  . Smoking status: Never Smoker  Substance Use Topics  . Alcohol use: No  . Drug use: No     Colonoscopy: Up to date/[name]  PAP:  Bone density:  Lipid panel:  Allergies  Allergen Reactions  . Aspirin Nausea Only    Gi upset    Current Outpatient Medications  Medication Sig Dispense Refill  . ALPRAZolam (XANAX) 0.25 MG tablet Take 0.25 mg by mouth at bedtime as needed for anxiety.    . benazepril (LOTENSIN) 20 MG tablet Take 20 mg by mouth daily.    . butalbital-acetaminophen-caffeine (FIORICET, ESGIC) 50-325-40 MG per tablet TAKE 1 OR 2 TABLETS EVERY 6 HOURS AS NEEDED FOR SEVERE HEADACHE  4  . Cholecalciferol 1000 UNITS capsule Take 2,000 Units by mouth daily.     . citalopram (CELEXA) 10 MG tablet Take 1 tablet (10 mg total) by mouth daily. 30 tablet 3  . levothyroxine (SYNTHROID, LEVOTHROID) 75 MCG tablet Take 75 mcg by mouth daily before breakfast.    . metoprolol tartrate (LOPRESSOR) 25 MG tablet Take 25 mg by mouth 2 (two) times daily.  3  . mirtazapine (REMERON) 15 MG tablet TAKE 1 TABLET BY MOUTH EVERYDAY AT  BEDTIME 90 tablet 0  . Multiple Vitamin (MULTIVITAMIN) tablet Take 1 tablet by mouth daily.    . nitroGLYCERIN (NITROSTAT) 0.4 MG SL tablet Place 0.4 mg under the tongue every 5 (five) minutes as needed for chest pain. Reported on 11/10/2015    . omeprazole (PRILOSEC) 20 MG capsule Take 20 mg by mouth daily as needed.     . pravastatin (PRAVACHOL) 40 MG tablet Take 40 mg by mouth daily.    . temazepam (RESTORIL) 15 MG capsule Take 15 mg by mouth at bedtime as needed for sleep.    . traMADol (ULTRAM) 50 MG tablet TAKE 1 tablet up to 4 times a day as needed for pain 120 tablet 0   No current facility-administered medications for this visit.     OBJECTIVE: Middle-aged white woman walking with a cane  Vitals:   01/15/19 1150  BP: (!) 184/84  Pulse: 61  Resp: 18  Temp: 99.1  F (37.3 C)  SpO2: 99%     Body mass index is 19.94 kg/m.    ECOG FS:2 - Symptomatic, <50% confined to bed Last Weight  Most recent update: 01/15/2019 12:19 PM   Weight  49.4 kg (109 lb)          Repeat blood pressure by my check was 142/86  Sclerae unicteric, EOMs intact Wearing a mask No cervical or supraclavicular adenopathy Lungs no rales or rhonchi Heart regular rate and rhythm Abd soft, nontender, positive bowel sounds MSK no upper extremity lymphedema Neuro: nonfocal, well oriented, depressed affect Breasts: The right breast is unremarkable.  The left breast contour is changed because of lumpectomy.  The erythema previously noted is pretty much resolved.  There is no evidence of disease recurrence.  Both axillae are benign.  Left breast, 09/11/2015    LAB RESULTS:   CMP     Component Value Date/Time   NA 139 01/15/2019 1107   NA 134 (L) 03/15/2017 1103   K 4.3 01/15/2019 1107   K 4.5 03/15/2017 1103   CL 103 01/15/2019 1107   CO2 29 01/15/2019 1107   CO2 28 03/15/2017 1103   GLUCOSE 98 01/15/2019 1107   GLUCOSE 91 03/15/2017 1103   BUN 22 01/15/2019 1107   BUN 15.9 03/15/2017 1103    CREATININE 0.85 01/15/2019 1107   CREATININE 0.8 03/15/2017 1103   CALCIUM 9.5 01/15/2019 1107   CALCIUM 9.8 03/15/2017 1103   PROT 7.2 01/15/2019 1107   PROT 7.0 03/15/2017 1103   ALBUMIN 4.3 01/15/2019 1107   ALBUMIN 4.1 03/15/2017 1103   AST 28 01/15/2019 1107   AST 24 03/15/2017 1103   ALT 20 01/15/2019 1107   ALT 14 03/15/2017 1103   ALKPHOS 47 01/15/2019 1107   ALKPHOS 69 03/15/2017 1103   BILITOT <0.2 (L) 01/15/2019 1107   BILITOT 0.23 03/15/2017 1103   GFRNONAA >60 01/15/2019 1107   GFRAA >60 01/15/2019 1107    I No results found for: SPEP  Lab Results  Component Value Date   WBC 5.8 01/15/2019   NEUTROABS 3.5 01/15/2019   HGB 11.9 (L) 01/15/2019   HCT 37.1 01/15/2019   MCV 100.3 (H) 01/15/2019   PLT 304 01/15/2019      Chemistry      Component Value Date/Time   NA 139 01/15/2019 1107   NA 134 (L) 03/15/2017 1103   K 4.3 01/15/2019 1107   K 4.5 03/15/2017 1103   CL 103 01/15/2019 1107   CO2 29 01/15/2019 1107   CO2 28 03/15/2017 1103   BUN 22 01/15/2019 1107   BUN 15.9 03/15/2017 1103   CREATININE 0.85 01/15/2019 1107   CREATININE 0.8 03/15/2017 1103      Component Value Date/Time   CALCIUM 9.5 01/15/2019 1107   CALCIUM 9.8 03/15/2017 1103   ALKPHOS 47 01/15/2019 1107   ALKPHOS 69 03/15/2017 1103   AST 28 01/15/2019 1107   AST 24 03/15/2017 1103   ALT 20 01/15/2019 1107   ALT 14 03/15/2017 1103   BILITOT <0.2 (L) 01/15/2019 1107   BILITOT 0.23 03/15/2017 1103       No results found for: LABCA2  No components found for: LABCA125  No results for input(s): INR in the last 168 hours.  Urinalysis    Component Value Date/Time   COLORURINE YELLOW 01/15/2019 1107    STUDIES No results found.   ASSESSMENT: 78 y.o. [place] woman s/p left upper-outer quadrant biopsy 01/16/2014 for a clinical T1b N0,  stage IA invasive ductal breast cancer  (1) status post left lumpectomy and sentinel lymph node sampling 03/21/2014 for a pT1b pN0, stage IA  invasive ductal carcinoma, repeat HER-2 again negative.  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 noted on CT / angiogram of the chest 12/25/2015   (a) CT of the abdomen and pelvis obtained at [facility] 04/04/2017 stable  (b) MRI of the abdomen at [facility] 06/15/2017 stable  (5) left lower extremity duplex ultrasound 12/25/2015 showed occlusive thrombus in the posterior tibial and peroneal veins  (a) apixaban/Eliquis started 12/25/2015  (b) repeat Doppler ultrasonography 03/17/2016 reportedly negative--Apixaban stopped  (6) area of erythema lateral to the left areola biopsy July 2017 and 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  She appears to me to be clinically depressed.  She is in chronic pain and is not getting much relief.  We tried mirtazapine but she tells me it caused her heart rate to go up so she is not taking that.  Today we discussed diet issues and I have urged her to eat more carbohydrates.  She says she likes bread.  I have asked her to try to gain 5 pounds before she returns to see me in November.  She had a CT of the abdomen showing an adrenal adenoma and pancreatic cyst.  We will repeat that this month for follow-up.  I am going to see her again in November but she would prefer that to be a phone visit and we will arrange for that  Otherwise she knows to call for any other problems that may develop before the return visit.  Dahlia Nifong, Virgie Dad, MD  01/15/19 12:36 PM Medical Oncology and Hematology [facility] [address] [place],  [address] Tel. [phone]    Fax. [phone]   I, Wilburn Mylar, am acting as  scribe for Dr. Virgie Dad. Regis Hinton.  I, Lurline Del MD, have reviewed the above documentation for accuracy and completeness, and I agree with the above.

## 2019-01-16 ENCOUNTER — Other Ambulatory Visit: Payer: Self-pay | Admitting: Oncology

## 2019-01-16 ENCOUNTER — Telehealth: Payer: Self-pay | Admitting: Hematology and Oncology

## 2019-01-16 NOTE — Telephone Encounter (Signed)
I talk with patient regarding visit  °

## 2019-01-16 NOTE — Progress Notes (Signed)
I called Zoe Boone to let her know she had a urine infection.  We started her on amoxicillin when she was here although I do not find that on her list of medications.  She is taking it without side effects and she says her urine symptoms are improved.  I will check on the sensitivities as soon as they become available.

## 2019-01-17 ENCOUNTER — Other Ambulatory Visit: Payer: Self-pay | Admitting: Oncology

## 2019-01-17 LAB — URINE CULTURE: Culture: >=100000 — AB

## 2019-01-17 MED ORDER — NITROFURANTOIN MONOHYD MACRO 100 MG PO CAPS: 100.0000 mg | ORAL_CAPSULE | Freq: Two times a day (BID) | ORAL | 0 refills | Status: DC

## 2019-01-17 NOTE — Progress Notes (Signed)
Zoe Boone's e coli is resistant to amoxicillin, I am switching her to nitrofurantoin.  I called her to let her know.

## 2019-01-26 ENCOUNTER — Other Ambulatory Visit: Payer: Self-pay | Admitting: *Deleted

## 2019-02-01 ENCOUNTER — Telehealth: Payer: Self-pay | Admitting: *Deleted

## 2019-02-01 MED ORDER — AMOXICILLIN-POT CLAVULANATE 875-125 MG PO TABS: 1.0000 | ORAL_TABLET | Freq: Two times a day (BID) | ORAL | 0 refills | Status: DC

## 2019-02-14 ENCOUNTER — Encounter: Payer: Self-pay | Admitting: Oncology

## 2019-02-15 ENCOUNTER — Telehealth: Payer: Self-pay

## 2019-02-15 NOTE — Telephone Encounter (Signed)
RN spoke with patient regarding MRI results after MD reviewed.  All questions answered, no further needs at this time.

## 2019-03-21 ENCOUNTER — Telehealth: Payer: Self-pay

## 2019-03-21 NOTE — Telephone Encounter (Signed)
Spoke with pt by phone.  She states that Dr Phillips Hay has received a copy of her most recent MRI

## 2019-05-08 ENCOUNTER — Other Ambulatory Visit: Payer: Self-pay | Admitting: *Deleted

## 2019-05-08 DIAGNOSIS — C50412 Malignant neoplasm of upper-outer quadrant of left female breast: Secondary | ICD-10-CM

## 2019-05-08 DIAGNOSIS — Z170 Estrogen receptor positive status [ER+]: Secondary | ICD-10-CM

## 2019-05-10 ENCOUNTER — Ambulatory Visit: Payer: Medicare Other | Admitting: Hematology and Oncology

## 2019-06-26 ENCOUNTER — Other Ambulatory Visit: Payer: Self-pay | Admitting: Oncology

## 2019-07-25 NOTE — Telephone Encounter (Signed)
No entry 

## 2019-08-29 ENCOUNTER — Other Ambulatory Visit: Payer: Self-pay | Admitting: Oncology

## 2019-08-29 ENCOUNTER — Other Ambulatory Visit: Payer: Self-pay | Admitting: *Deleted

## 2019-10-17 ENCOUNTER — Other Ambulatory Visit: Payer: Self-pay

## 2019-10-17 ENCOUNTER — Telehealth: Payer: Self-pay

## 2019-10-17 MED ORDER — MIRTAZAPINE 15 MG PO TABS: 15.0000 mg | ORAL_TABLET | Freq: Every day | ORAL | 0 refills | Status: DC

## 2019-10-17 NOTE — Telephone Encounter (Signed)
RN returned call, pt requesting refill on Remeron 15mg  tablet.   Per MD note, medication was discontinued previously.  OK per MD to re-start to encourage increase in appetite.  Pt notified, follow up visit scheduled.

## 2019-10-31 ENCOUNTER — Ambulatory Visit: Payer: Medicare Other | Admitting: Oncology

## 2019-11-05 NOTE — Progress Notes (Signed)
[place]  [phone] [phone]     ID: Zoe Boone DOB: 1940-11-24  MR#: [id number]  SNK#:[id number]  Patient Care Team: Raina Mina., MD as PCP - General (Internal Medicine) Letica Giaimo, Virgie Dad, MD as Consulting Physician (Oncology) Thea Silversmith, MD as Consulting Physician (Radiation Oncology) Richardo Priest, MD as Referring Physician (Cardiology) Fanny Skates, MD as Consulting Physician (General Surgery)  OTHER MD: Gatha Mayer M.D., Everlene Farrier M.D.  I connected with Magnus Sinning on 11/05/19 at 11:00 AM EDT by telephone visit and verified that I am speaking with the correct person using two identifiers.   I discussed the limitations, risks, security and privacy concerns of performing an evaluation and management service by telemedicine and the availability of in-person appointments. I also discussed with the patient that there may be a patient responsible charge related to this service. The patient expressed understanding and agreed to proceed.   Other persons participating in the visit and their role in the encounter:none  Patient's location: home  Provider's location: [place]: estrogen receptor positive breast cancer  CURRENT TREATMENT: Observation   INTERVAL HISTORY: Zoe Boone was contacted today for follow-up of her estrogen receptor positive breast cancer.  She continues under observation alone.   Since her last visit, she underwent abdomen MRI on 02/14/2019 at [facility] showing: slight interval decrease in size of cystic pancreatic tail lesion, now 1.4 cm; otherwise, stable exam without new or acute interval finding.  Since her last visit, she underwent bilateral diagnostic mammography with tomography at [facility] on 05/26/2019 showing: breast density category C; no evidence of malignancy in either breast.    REVIEW OF SYSTEMS: Zoe Boone continues to have pain "all over".   We have evaluated this previously and it is consistent with arthritis.  She is very concerned about her breasts.  She thinks she has found a possible new lump in the left breast and a difference which she cannot define very well in the right breast.  She tells me she keeps losing weight.  She has significant urinary incontinence and she cannot get out of the house as a result.  She has an appointment with Dr. Zigmund Daniel tomorrow which hopefully will be helpful.  She tells me she lost a brother to bladder cancer 6 months ago and another brother to kidney cancer 6 years ago.  Aside from these issues a detailed review of systems today was stable  Wt Readings from Last 3 Encounters:  01/15/19 109 lb (49.4 kg)  03/14/18 114 lb (51.7 kg)  06/22/17 123 lb 12.8 oz (56.2 kg)      BREAST CANCER HISTORY: From the original intake note:  "Zoe Boone" had routine screening mammography in [facility] showing a possible mass in the upper left breast. Additional views and left breast ultrasonography confirmed a 7 mm mass in the upper-outer quadrant. This was biopsied under ultrasound guidance in [facility] 01/17/2014, and the pathology showed ([id number]) an invasive ductal carcinoma, E-cadherin positive, grade 1 or 2, estrogen receptor 99% positive, progesterone receptor 14% positive, with no amplification of HER-2, and with an MIB-1 of 17%.  On 02/19/2014 the patient underwent bilateral breast MRI. This showed a signal void in the superior left breast, but no abnormal enhancement, suggesting that most if not all of the original lesion was removed and biopsied.  The patient's subsequent history is as detailed below   PAST MEDICAL HISTORY: Past Medical History:  Diagnosis Date  .  Anxiety   . Arthritis   . Cancer [facility]) [id number]   left breast mammary carcinoma  . GERD (gastroesophageal reflux disease)   . Hyperlipidemia   . Hypertension   . Personal history of radiation therapy   . PONV  (postoperative nausea and vomiting)   . Thyroid disease     PAST SURGICAL HISTORY: Past Surgical History:  Procedure Laterality Date  . ABDOMINAL HYSTERECTOMY  1985  . BLADDER SUSPENSION  1992  . BREAST SURGERY     multiple br bx  . BUNIONECTOMY  1990   lt/rt  . CATARACT EXTRACTION     bilat.   . COLONOSCOPY  2014  . KNEE ARTHROSCOPY  2010   right     FAMILY HISTORY Family History  Problem Relation Age of Onset  . Leukemia Father   . Diabetes Father   . Cancer Father        luekemia  . Skin cancer Mother   . Hypertension Mother   . Kidney cancer Brother   . Melanoma Brother    the patient's father died from complications of chronic leukemia at the age of 34. He had been diagnosed at age 45. The patient's mother died from "old age" at age 57. The patient has 3 brothers and 2 sisters. One brother was diagnosed with kidney cancer at the age of 61. There is one maternal aunt (out of 2) with breast cancer diagnosed at age 34, , and one paternal aunt (7) diagnosed with breast cancer at age 36. There is no history of ovarian cancer in the family.   GYNECOLOGIC HISTORY:  No LMP recorded. Patient has had a hysterectomy. Menarche age 34, first live birth age 12. The patient is GX P2. She underwent abdominal hysterectomy and subsequent bilateral salpingo-oophorectomy at age 89. She took hormone replacement for approximately 10 years.   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 Her husband [name] "Marguerite Olea" Vasallo is retired from [employer]. It suggests that 2 of them at home, with no pets. Their daughter [name] is a Education officer, museum in [place], as is your second daughter, [name]. The patient has 4 grandchildren, including a granddaughter who is just entering medical school in [place], 2 grandsons at [employer], and a fourth is a Print production planner. The patient attends a local [name]    ADVANCED  DIRECTIVES: Not in place   HEALTH MAINTENANCE: Social History   Tobacco Use  . Smoking status: Never Smoker  Substance Use Topics  . Alcohol use: No  . Drug use: No     Colonoscopy: Up to date/[name]  PAP:  Bone density:  Lipid panel:  Allergies  Allergen Reactions  . Aspirin Nausea Only    Gi upset    Current Outpatient Medications  Medication Sig Dispense Refill  . ALPRAZolam (XANAX) 0.25 MG tablet Take 0.25 mg by mouth at bedtime as needed for anxiety.    Marland Kitchen amoxicillin-clavulanate (AUGMENTIN) 875-125 MG tablet Take 1 tablet by mouth 2 (two) times daily. 10 tablet 0  . benazepril (LOTENSIN) 20 MG tablet Take 20 mg by mouth daily.    . butalbital-acetaminophen-caffeine (FIORICET, ESGIC) 50-325-40 MG per tablet TAKE 1 OR 2 TABLETS EVERY 6 HOURS AS NEEDED FOR SEVERE HEADACHE  4  . Cholecalciferol 1000 UNITS capsule Take 2,000 Units by mouth daily.     . citalopram (CELEXA) 10 MG tablet Take 1 tablet (10 mg total)  by mouth daily. 30 tablet 3  . levothyroxine (SYNTHROID, LEVOTHROID) 75 MCG tablet Take 75 mcg by mouth daily before breakfast.    . metoprolol tartrate (LOPRESSOR) 25 MG tablet Take 25 mg by mouth 2 (two) times daily.  3  . mirtazapine (REMERON) 15 MG tablet Take 1 tablet (15 mg total) by mouth at bedtime. 90 tablet 0  . Multiple Vitamin (MULTIVITAMIN) tablet Take 1 tablet by mouth daily.    . nitrofurantoin, macrocrystal-monohydrate, (MACROBID) 100 MG capsule Take 1 capsule (100 mg total) by mouth 2 (two) times daily. 14 capsule 0  . nitroGLYCERIN (NITROSTAT) 0.4 MG SL tablet Place 0.4 mg under the tongue every 5 (five) minutes as needed for chest pain. Reported on 11/10/2015    . omeprazole (PRILOSEC) 20 MG capsule Take 20 mg by mouth daily as needed.     . pravastatin (PRAVACHOL) 40 MG tablet Take 40 mg by mouth daily.    . temazepam (RESTORIL) 15 MG capsule Take 15 mg by mouth at bedtime as needed for sleep.    . traMADol (ULTRAM) 50 MG tablet TAKE 1  tablet up to 4 times a day as needed for pain 120 tablet 0   No current facility-administered medications for this visit.    OBJECTIVE: white woman who walks with a cane  There were no vitals filed for this visit.   There is no height or weight on file to calculate BMI.    ECOG FS:2 - Symptomatic, <50% confined to bed Repeat blood pressure by my check was 142/86  Telephone visit 11/06/2019  Left breast, 09/11/2015    LAB RESULTS:   CMP     Component Value Date/Time   NA 139 01/15/2019 1107   NA 134 (L) 03/15/2017 1103   K 4.3 01/15/2019 1107   K 4.5 03/15/2017 1103   CL 103 01/15/2019 1107   CO2 29 01/15/2019 1107   CO2 28 03/15/2017 1103   GLUCOSE 98 01/15/2019 1107   GLUCOSE 91 03/15/2017 1103   BUN 22 01/15/2019 1107   BUN 15.9 03/15/2017 1103   CREATININE 0.85 01/15/2019 1107   CREATININE 0.8 03/15/2017 1103   CALCIUM 9.5 01/15/2019 1107   CALCIUM 9.8 03/15/2017 1103   PROT 7.2 01/15/2019 1107   PROT 7.0 03/15/2017 1103   ALBUMIN 4.3 01/15/2019 1107   ALBUMIN 4.1 03/15/2017 1103   AST 28 01/15/2019 1107   AST 24 03/15/2017 1103   ALT 20 01/15/2019 1107   ALT 14 03/15/2017 1103   ALKPHOS 47 01/15/2019 1107   ALKPHOS 69 03/15/2017 1103   BILITOT <0.2 (L) 01/15/2019 1107   BILITOT 0.23 03/15/2017 1103   GFRNONAA >60 01/15/2019 1107   GFRAA >60 01/15/2019 1107    I No results found for: SPEP  Lab Results  Component Value Date   WBC 5.8 01/15/2019   NEUTROABS 3.5 01/15/2019   HGB 11.9 (L) 01/15/2019   HCT 37.1 01/15/2019   MCV 100.3 (H) 01/15/2019   PLT 304 01/15/2019      Chemistry      Component Value Date/Time   NA 139 01/15/2019 1107   NA 134 (L) 03/15/2017 1103   K 4.3 01/15/2019 1107   K 4.5 03/15/2017 1103   CL 103 01/15/2019 1107   CO2 29 01/15/2019 1107   CO2 28 03/15/2017 1103   BUN 22 01/15/2019 1107   BUN 15.9 03/15/2017 1103   CREATININE 0.85 01/15/2019 1107   CREATININE 0.8 03/15/2017 1103      Component  Value Date/Time    CALCIUM 9.5 01/15/2019 1107   CALCIUM 9.8 03/15/2017 1103   ALKPHOS 47 01/15/2019 1107   ALKPHOS 69 03/15/2017 1103   AST 28 01/15/2019 1107   AST 24 03/15/2017 1103   ALT 20 01/15/2019 1107   ALT 14 03/15/2017 1103   BILITOT <0.2 (L) 01/15/2019 1107   BILITOT 0.23 03/15/2017 1103       No results found for: LABCA2  No components found for: LABCA125  No results for input(s): INR in the last 168 hours.  Urinalysis    Component Value Date/Time   COLORURINE YELLOW 01/15/2019 1107    STUDIES No results found.   ASSESSMENT: 79 y.o. [place] woman s/p left upper-outer quadrant biopsy 01/16/2014 for a clinical T1b N0, stage IA invasive ductal breast cancer  (1) status post left lumpectomy and sentinel lymph node sampling 03/21/2014 for a pT1b pN0, stage IA invasive ductal carcinoma, repeat HER-2 again negative.  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 noted on CT / angiogram of the chest 12/25/2015   (a) CT of the abdomen and pelvis obtained at [facility] 04/04/2017 stable  (b) MRI of the abdomen at [facility] 06/15/2017 stable  (5) left lower extremity duplex ultrasound 12/25/2015 showed occlusive thrombus in the posterior tibial and peroneal veins  (a) apixaban/Eliquis started 12/25/2015  (b) repeat Doppler ultrasonography 03/17/2016 reportedly negative--Apixaban stopped  (6) area of erythema lateral to the left areola biopsy July 2017 and 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  This is very favorable.  She continues to be very worried about her breasts.  Given the  fact that she has found some difference in both breasts were proceeding with mammograms this month.  We are also following a pancreatic cyst and she will have a MRI of the abdomen in October.  We will then talk by phone assuming everything is okay.  Otherwise of course she would have to come here for evaluation.  I am delighted that she will be seeing our urologic gynecologist and hopefully they can help her become a little bit more free.  She knows to call for any other issue that may develop before the next visit   Ugo Thoma, Virgie Dad, MD  11/05/19 4:31 PM Medical Oncology and Hematology [facility] [place], [place] [address] Tel. [phone]    Fax. [phone]   I, Wilburn Mylar, am acting as scribe for Dr. Virgie Dad. Chau Savell.  I, Lurline Del MD, have reviewed the above documentation for accuracy and completeness, and I agree with the above.   *Total Encounter Time as defined by the Centers for Medicare and Medicaid Services includes, in addition to the face-to-face time of a patient visit (documented in the note above) non-face-to-face time: obtaining and reviewing outside history, ordering and reviewing medications, tests or procedures, care coordination (communications with other health care professionals or caregivers) and documentation in the medical record.

## 2019-11-06 ENCOUNTER — Inpatient Hospital Stay: Payer: Medicare Other | Attending: Oncology | Admitting: Oncology

## 2019-11-06 DIAGNOSIS — Z8051 Family history of malignant neoplasm of kidney: Secondary | ICD-10-CM | POA: Diagnosis not present

## 2019-11-06 DIAGNOSIS — I10 Essential (primary) hypertension: Secondary | ICD-10-CM | POA: Diagnosis not present

## 2019-11-06 DIAGNOSIS — Z803 Family history of malignant neoplasm of breast: Secondary | ICD-10-CM | POA: Diagnosis not present

## 2019-11-06 DIAGNOSIS — Z170 Estrogen receptor positive status [ER+]: Secondary | ICD-10-CM | POA: Diagnosis not present

## 2019-11-06 DIAGNOSIS — K219 Gastro-esophageal reflux disease without esophagitis: Secondary | ICD-10-CM | POA: Insufficient documentation

## 2019-11-06 DIAGNOSIS — M1990 Unspecified osteoarthritis, unspecified site: Secondary | ICD-10-CM | POA: Insufficient documentation

## 2019-11-06 DIAGNOSIS — Z9223 Personal history of estrogen therapy: Secondary | ICD-10-CM | POA: Diagnosis not present

## 2019-11-06 DIAGNOSIS — Z853 Personal history of malignant neoplasm of breast: Secondary | ICD-10-CM | POA: Diagnosis not present

## 2019-11-06 DIAGNOSIS — F419 Anxiety disorder, unspecified: Secondary | ICD-10-CM | POA: Diagnosis not present

## 2019-11-06 DIAGNOSIS — Z79899 Other long term (current) drug therapy: Secondary | ICD-10-CM | POA: Insufficient documentation

## 2019-11-06 DIAGNOSIS — C50412 Malignant neoplasm of upper-outer quadrant of left female breast: Secondary | ICD-10-CM | POA: Diagnosis not present

## 2019-11-06 DIAGNOSIS — E785 Hyperlipidemia, unspecified: Secondary | ICD-10-CM | POA: Diagnosis not present

## 2019-11-06 DIAGNOSIS — K862 Cyst of pancreas: Secondary | ICD-10-CM

## 2019-11-06 DIAGNOSIS — Z8052 Family history of malignant neoplasm of bladder: Secondary | ICD-10-CM | POA: Diagnosis not present

## 2019-11-09 ENCOUNTER — Telehealth: Payer: Self-pay | Admitting: Oncology

## 2019-11-09 ENCOUNTER — Other Ambulatory Visit: Payer: Self-pay

## 2019-11-09 NOTE — Telephone Encounter (Signed)
Scheduled appts per 5/4 los. Pt confirmed appt date and time.

## 2020-01-10 ENCOUNTER — Other Ambulatory Visit: Payer: Self-pay

## 2020-01-10 MED ORDER — MIRTAZAPINE 15 MG PO TABS: 15.0000 mg | ORAL_TABLET | Freq: Every day | ORAL | 0 refills | Status: DC

## 2020-01-11 ENCOUNTER — Other Ambulatory Visit: Payer: Self-pay | Admitting: *Deleted

## 2020-01-19 ENCOUNTER — Other Ambulatory Visit: Payer: Self-pay | Admitting: Oncology

## 2020-01-19 NOTE — Progress Notes (Signed)
Received report of MRI of the abdomen with and without contrast performed 06 23 2021 at [facility] radiology and showing the unilocular simple appearing cystic lesion in the tail of the pancreas to measure 1.5 x 0.8 x 0.7, with no abnormal enhancement.  There is no interval growth.  Going back to October 2018 the renal lesion appears mildly reduced in size.  There is also a 1.6 cm left adrenal adenoma with benign appearance.  There is lumbar spondylosis and degenerative disc disease.

## 2020-04-13 ENCOUNTER — Other Ambulatory Visit: Payer: Self-pay | Admitting: Oncology

## 2020-05-07 ENCOUNTER — Encounter: Payer: Self-pay | Admitting: Oncology

## 2020-05-08 ENCOUNTER — Other Ambulatory Visit: Payer: Self-pay

## 2020-05-08 ENCOUNTER — Inpatient Hospital Stay: Payer: Medicare Other | Attending: Oncology | Admitting: Oncology

## 2020-05-08 DIAGNOSIS — C50412 Malignant neoplasm of upper-outer quadrant of left female breast: Secondary | ICD-10-CM

## 2020-05-08 DIAGNOSIS — Z170 Estrogen receptor positive status [ER+]: Secondary | ICD-10-CM | POA: Diagnosis not present

## 2020-05-08 NOTE — Progress Notes (Signed)
[place]  [phone] Fax:([phone]     ID: SHAVON ASHMORE DOB: 27-Nov-1940  MR#: [id number]  CWU#:[id number]  Patient Care Team: Raina Mina., MD as PCP - General (Internal Medicine) Edgel Degnan, Virgie Dad, MD as Consulting Physician (Oncology) Thea Silversmith, MD as Consulting Physician (Radiation Oncology) Richardo Priest, MD as Referring Physician (Cardiology) Boone Skates, MD as Consulting Physician (General Surgery) Marti Sleigh, MD as Referring Physician (Gynecology)  OTHER MD: Gatha Mayer M.D., Everlene Farrier M.D.  I connected with Magnus Sinning on 05/08/20 at 11:30 AM EDT by telephone visit and verified that I am speaking with the correct person using two identifiers.   I discussed the limitations, risks, security and privacy concerns of performing an evaluation and management service by telemedicine and the availability of in-person appointments. I also discussed with the patient that there may be a patient responsible charge related to this service. The patient expressed understanding and agreed to proceed.   Other persons participating in the visit and their role in the encounter:none  Patient's location: home  Provider's location: [place]: estrogen receptor positive breast cancer  CURRENT TREATMENT: Observation   INTERVAL HISTORY: Zoe Boone was contacted today for follow-up of her estrogen receptor positive breast cancer.  She continues under observation alone.   In June 2021 she underwent MRI of the abdomen with and without contrast at [facility] for evaluation of the pancreas.  There was a simple appearing cyst in the tail of the pancreas measuring 1.5 cm.  There was no abnormal enhancement.  Previously this was 1.4 cm which does not qualify as interval growth.  In fact the lesion is mildly reduced in size when reviewed against the 04/04/2017 study.  The lesion was probably present  and probably smaller in September 13, 2011.  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  On 05/06/2020 she had mammography at [facility] in [place] showing breast density category C.  There was an area of palpable concern in the left breast which was not otherwise reflected on mammography.  This was associated with her surgical scar and ultrasound of this area showed only scar.  There was no evidence of malignancy in either breast.  REVIEW OF SYSTEMS: Zoe Boone tells me when she lies down in bed her back hurts horribly.  She says this is due to scoliosis.  Otherwise she has been doing "pretty good".  COVID 19 VACCINATION STATUS: Received the [place] vaccine x2, most recently in February 2021   BREAST CANCER HISTORY: From the original intake note:  "Zoe Boone" had routine screening mammography in [facility] showing a possible mass in the upper left breast. Additional views and left breast ultrasonography confirmed a 7 mm mass in the upper-outer quadrant. This was biopsied under ultrasound guidance in [facility] 01/17/2014, and the pathology showed ([id number]) an invasive ductal carcinoma, E-cadherin positive, grade 1 or 2, estrogen receptor 99% positive, progesterone receptor 14% positive, with no amplification of HER-2, and with an MIB-1 of 17%.  On 02/19/2014 the patient underwent bilateral breast MRI. This showed a signal void in the superior left breast, but no abnormal enhancement, suggesting that most if not all of the original lesion was removed and biopsied.  The patient's subsequent history is as detailed below   PAST MEDICAL HISTORY: Past Medical History:  Diagnosis Date  . Anxiety   .  Arthritis   . Cancer [facility]) [id number]   left breast mammary carcinoma  . GERD (gastroesophageal reflux disease)   . Hyperlipidemia   . Hypertension   . Personal history of radiation therapy   . PONV (postoperative  nausea and vomiting)   . Thyroid disease     PAST SURGICAL HISTORY: Past Surgical History:  Procedure Laterality Date  . ABDOMINAL HYSTERECTOMY  1985  . BLADDER SUSPENSION  1992  . BREAST SURGERY     multiple br bx  . BUNIONECTOMY  1990   lt/rt  . CATARACT EXTRACTION     bilat.   . COLONOSCOPY  2014  . KNEE ARTHROSCOPY  2010   right     FAMILY HISTORY Family History  Problem Relation Age of Onset  . Leukemia Father   . Diabetes Father   . Cancer Father        luekemia  . Skin cancer Mother   . Hypertension Mother   . Kidney cancer Brother   . Melanoma Brother    the patient's father died from complications of chronic leukemia at the age of 71. He had been diagnosed at age 15. The patient's mother died from "old age" at age 44. The patient has 3 brothers and 2 sisters. One brother was diagnosed with kidney cancer at the age of 55. There is one maternal aunt (out of 2) with breast cancer diagnosed at age 75, , and one paternal aunt (28) diagnosed with breast cancer at age 56. There is no history of ovarian cancer in the family.   GYNECOLOGIC HISTORY:  No LMP recorded. Patient has had a hysterectomy. Menarche age 65, first live birth age 34. The patient is GX P2. She underwent abdominal hysterectomy and subsequent bilateral salpingo-oophorectomy at age 55. She took hormone replacement for approximately 10 years.   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 Her husband [name] "Marguerite Olea" Balcom is retired from [employer]. It suggests that 2 of them at home, with no pets. Their daughter Zoe Boone is a Education officer, museum in [place], as is your second daughter, Zoe Boone. The patient has 4 grandchildren, including a granddaughter who is just entering medical school in [place], 2 grandsons at [employer], and a fourth is a Print production planner. The patient attends a local [name]    ADVANCED DIRECTIVES: Not in  place   HEALTH MAINTENANCE: Social History   Tobacco Use  . Smoking status: Never Smoker  Substance Use Topics  . Alcohol use: No  . Drug use: No     Colonoscopy: Up to date/[name]  PAP:  Bone density:  Lipid panel:  Allergies  Allergen Reactions  . Aspirin Nausea Only    Gi upset    Current Outpatient Medications  Medication Sig Dispense Refill  . ALPRAZolam (XANAX) 0.25 MG tablet Take 0.25 mg by mouth at bedtime as needed for anxiety.    Marland Kitchen amoxicillin-clavulanate (AUGMENTIN) 875-125 MG tablet Take 1 tablet by mouth 2 (two) times daily. 10 tablet 0  . benazepril (LOTENSIN) 20 MG tablet Take 20 mg by mouth daily.    . butalbital-acetaminophen-caffeine (FIORICET, ESGIC) 50-325-40 MG per tablet TAKE 1 OR 2 TABLETS EVERY 6 HOURS AS NEEDED FOR SEVERE HEADACHE  4  . Cholecalciferol 1000 UNITS capsule Take 2,000 Units by mouth daily.     . citalopram (CELEXA) 10 MG tablet Take 1 tablet (10 mg total) by mouth daily. [place]  tablet 3  . levothyroxine (SYNTHROID, LEVOTHROID) 75 MCG tablet Take 75 mcg by mouth daily before breakfast.    . metoprolol tartrate (LOPRESSOR) 25 MG tablet Take 25 mg by mouth 2 (two) times daily.  3  . mirtazapine (REMERON) 15 MG tablet TAKE 1 TABLET BY MOUTH EVERYDAY AT BEDTIME 90 tablet 0  . Multiple Vitamin (MULTIVITAMIN) tablet Take 1 tablet by mouth daily.    . nitrofurantoin, macrocrystal-monohydrate, (MACROBID) 100 MG capsule Take 1 capsule (100 mg total) by mouth 2 (two) times daily. 14 capsule 0  . nitroGLYCERIN (NITROSTAT) 0.4 MG SL tablet Place 0.4 mg under the tongue every 5 (five) minutes as needed for chest pain. Reported on 11/10/2015    . omeprazole (PRILOSEC) 20 MG capsule Take 20 mg by mouth daily as needed.     . pravastatin (PRAVACHOL) 40 MG tablet Take 40 mg by mouth daily.    . temazepam (RESTORIL) 15 MG capsule Take 15 mg by mouth at bedtime as needed for sleep.    . traMADol (ULTRAM) 50 MG tablet TAKE 1 tablet up to 4 times a day  as needed for pain 120 tablet 0   No current facility-administered medications for this visit.    OBJECTIVE:   There were no vitals filed for this visit.   There is no height or weight on file to calculate BMI.    ECOG FS:2 - Symptomatic, <50% confined to bed   Telephone visit 05/08/2020  Left breast, 09/11/2015    LAB RESULTS:   CMP     Component Value Date/Time   NA 139 01/15/2019 1107   NA 134 (L) 03/15/2017 1103   K 4.3 01/15/2019 1107   K 4.5 03/15/2017 1103   CL 103 01/15/2019 1107   CO2 29 01/15/2019 1107   CO2 28 03/15/2017 1103   GLUCOSE 98 01/15/2019 1107   GLUCOSE 91 03/15/2017 1103   BUN 22 01/15/2019 1107   BUN 15.9 03/15/2017 1103   CREATININE 0.85 01/15/2019 1107   CREATININE 0.8 03/15/2017 1103   CALCIUM 9.5 01/15/2019 1107   CALCIUM 9.8 03/15/2017 1103   PROT 7.2 01/15/2019 1107   PROT 7.0 03/15/2017 1103   ALBUMIN 4.3 01/15/2019 1107   ALBUMIN 4.1 03/15/2017 1103   AST 28 01/15/2019 1107   AST 24 03/15/2017 1103   ALT 20 01/15/2019 1107   ALT 14 03/15/2017 1103   ALKPHOS 47 01/15/2019 1107   ALKPHOS 69 03/15/2017 1103   BILITOT <0.2 (L) 01/15/2019 1107   BILITOT 0.23 03/15/2017 1103   GFRNONAA >60 01/15/2019 1107   GFRAA >60 01/15/2019 1107    I No results found for: SPEP  Lab Results  Component Value Date   WBC 5.8 01/15/2019   NEUTROABS 3.5 01/15/2019   HGB 11.9 (L) 01/15/2019   HCT 37.1 01/15/2019   MCV 100.3 (H) 01/15/2019   PLT 304 01/15/2019      Chemistry      Component Value Date/Time   NA 139 01/15/2019 1107   NA 134 (L) 03/15/2017 1103   K 4.3 01/15/2019 1107   K 4.5 03/15/2017 1103   CL 103 01/15/2019 1107   CO2 29 01/15/2019 1107   CO2 28 03/15/2017 1103   BUN 22 01/15/2019 1107   BUN 15.9 03/15/2017 1103   CREATININE 0.85 01/15/2019 1107   CREATININE 0.8 03/15/2017 1103      Component Value Date/Time   CALCIUM 9.5 01/15/2019 1107   CALCIUM 9.8 03/15/2017 1103   ALKPHOS 47 01/15/2019  1107   ALKPHOS 69  03/15/2017 1103   AST 28 01/15/2019 1107   AST 24 03/15/2017 1103   ALT 20 01/15/2019 1107   ALT 14 03/15/2017 1103   BILITOT <0.2 (L) 01/15/2019 1107   BILITOT 0.23 03/15/2017 1103       No results found for: LABCA2  No components found for: LABCA125  No results for input(s): INR in the last 168 hours.  Urinalysis    Component Value Date/Time   COLORURINE YELLOW 01/15/2019 1107    STUDIES No results found.   ASSESSMENT: 79 y.o. [place] woman s/p left upper-outer quadrant biopsy 01/16/2014 for a clinical T1b N0, stage IA invasive ductal breast cancer  (1) status post left lumpectomy and sentinel lymph node sampling 03/21/2014 for a pT1b pN0, stage IA invasive ductal carcinoma, repeat HER-2 again negative.  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 noted on CT / angiogram of the chest 12/25/2015   (a) CT of the abdomen and pelvis obtained at [facility] 04/04/2017 stable  (b) MRI of the abdomen at [facility] 06/15/2017 stable  (c) repeat abdominal MRI at [facility] June 2021 again stable  (5) left lower extremity duplex ultrasound 12/25/2015 showed occlusive thrombus in the posterior tibial and peroneal veins  (a) apixaban/Eliquis started 12/25/2015  (b) repeat Doppler ultrasonography 03/17/2016 reportedly negative--Apixaban stopped  (6) area of erythema lateral to the left areola biopsy July 2017 and 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  This is very favorable.  We reviewed the pancreatic cyst film which is also very reassuring.  At her request we  are going to continue to "keep an eye on things".  We will have another of these virtual visits in 6 months.  I reassured her that at this point I do not see any evidence of recurrent breast cancer.  Jurney Overacker, Virgie Dad, MD  05/08/20 3:49 PM Medical Oncology and Hematology [facility] [place], [place] [address] Tel. [phone]    Fax. [phone]   I, Wilburn Mylar, am acting as scribe for Dr. Virgie Dad. Jaslyn Bansal.  I, Lurline Del MD, have reviewed the above documentation for accuracy and completeness, and I agree with the above.   *Total Encounter Time as defined by the Centers for Medicare and Medicaid Services includes, in addition to the face-to-face time of a patient visit (documented in the note above) non-face-to-face time: obtaining and reviewing outside history, ordering and reviewing medications, tests or procedures, care coordination (communications with other health care professionals or caregivers) and documentation in the medical record.

## 2020-05-13 ENCOUNTER — Telehealth: Payer: Self-pay | Admitting: Oncology

## 2020-05-13 NOTE — Telephone Encounter (Signed)
No 11/4 los, no changes made to pt schedule

## 2020-07-13 ENCOUNTER — Other Ambulatory Visit: Payer: Self-pay | Admitting: Oncology

## 2020-10-10 ENCOUNTER — Other Ambulatory Visit: Payer: Self-pay | Admitting: Oncology

## 2020-12-17 ENCOUNTER — Encounter: Payer: Self-pay | Admitting: Oncology

## 2021-01-09 ENCOUNTER — Other Ambulatory Visit: Payer: Self-pay | Admitting: Oncology

## 2021-04-10 ENCOUNTER — Other Ambulatory Visit: Payer: Self-pay | Admitting: Oncology

## 2021-05-06 ENCOUNTER — Encounter: Payer: Self-pay | Admitting: Oncology

## 2021-05-07 ENCOUNTER — Encounter: Payer: Self-pay | Admitting: Oncology

## 2021-07-10 ENCOUNTER — Other Ambulatory Visit: Payer: Self-pay | Admitting: Oncology

## 2022-05-14 ENCOUNTER — Telehealth: Payer: Self-pay | Admitting: *Deleted

## 2022-05-14 DIAGNOSIS — D4410 Neoplasm of uncertain behavior of unspecified adrenal gland: Secondary | ICD-10-CM

## 2022-05-14 DIAGNOSIS — K862 Cyst of pancreas: Secondary | ICD-10-CM

## 2022-05-14 DIAGNOSIS — Z170 Estrogen receptor positive status [ER+]: Secondary | ICD-10-CM

## 2022-05-14 NOTE — Telephone Encounter (Signed)
This RN called pt per faxed inquiry from her urologist-inquiring about use of localized vaginal cream for chronic cystitis overactive bladder.  Noted pt chart pt was last seen 05/2020 by Dr Jana Hakim who is now retired and did not have follow up appt . She has not been seen by another provider.  She is now 7 years out from breast cancer dx and is not on anti estrogen post stopping tamoxifen due to develop of blood clot.  This RN called Zoe Boone per above and discussed need to see her for this office to give ok to proceed with urology recommendation.  Per discussion- Zoe Boone would prefer to be seen in [place]- this RN informed her referral will be placed for follow up.  This RN will inform urologist of above for communication relating to request.

## 2022-05-17 NOTE — Progress Notes (Unsigned)
[facility] Initial Visit:  Patient Care Team: Raina Mina., MD as PCP - General (Internal Medicine) Magrinat, Virgie Dad, MD (Inactive) as Consulting Physician (Oncology) Thea Silversmith, MD as Consulting Physician (Radiation Oncology) Richardo Priest, MD as Referring Physician (Cardiology) Fanny Skates, MD as Consulting Physician (General Surgery) Marti Sleigh, MD as Referring Physician (Gynecology)  CHIEF COMPLAINTS/PURPOSE OF CONSULTATION:  Oncology History   No history exists.    HISTORY OF PRESENTING ILLNESS: Zoe Boone 81 y.o. female is here because of  history of breast cancer  Medical history notable for left-sided breast cancer diagnosed in 2015 and treated with adjuvant radiation therapy, hypertension, hyperlipidemia, hysterectomy, thyroid disease  March 21 2014:  Lumpectomy left breast and sentinel lymph node sampling for a pT1b pN0, stage IA invasive ductal carcinoma, repeat HER-2 again negative.  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    December 26 2019:  MRI abdomen.   Unilocular simple  appearing cystic lesion in the tail the pancreas measures 1.5 by 0.8 by 0.7 cm. No abnormal enhancement. By  my measurements this lesion was previously 1.4 by 0.7 by 0.8 cm. This does not qualify as interval growth. This lesion is mildly reduced in size compared back through 04/04/2017. No definite  connectivity to the dorsal pancreatic duct. This lesion was present although more difficult to measure and probably smaller back on 09/13/2011. Adrenals/Urinary Tract: 1.2 by 1.6 cm left adrenal adenoma with  dropout of signal on out of phase images. Retained fetal lobularity.  Small benign-appearing renal cysts.   March 10 2022:    Sodium 135 - 146 MMOL/L 143   Potassium 3.5 - 5.3 MMOL/L 4.3 NO VISIBLE HEMOLYSIS  Chloride 98 - 110 MMOL/L 104   CO2 21 - 31 MMOL/L 31   BUN 8 - 24 MG/DL 23   Glucose 70 - 99 MG/DL 82   Creatinine 0.60 - 1.20 MG/DL 0.87   Calcium 8.5 - 10.5 MG/DL 9.5   Total Protein 6.4 - 8.9 G/DL 6.7   Albumin 3.5 - 5.7 G/DL 4.3   Total Bilirubin 0.0 - 1.0 MG/DL 0.6   Alkaline Phosphatase 34 - 104 IU/L or U/L 88   AST (SGOT) 13 - 39 IU/L or U/L 25   ALT (SGPT) 7 - 52 IU/L or U/L 16   Anion Gap 4 - 14 MMOL/L 8   Est. GFR >=60 ML/MIN/1.73 M*2 68    WBC 4.4 - 11.0 x 10*3/uL 4.5  RBC 4.10 - 5.10 x 10*6/uL 3.84 Low   Hemoglobin 12.3 - 15.3 G/DL 12.2 Low   Hematocrit 35.9 - 44.6 % 36.8  MCV 80.0 - 96.0 FL 95.9  MCH 27.5 - 33.2 PG 31.7  MCHC 33.0 - 37.0 G/DL 33.0  RDW 12.3 - 17.0 % 12.8  Platelets 150 - 450 X 10*3/uL 259  MPV 6.8 - 10.2 FL 8.4  Neutrophil % % 57  Lymphocyte % % 30  Monocyte % % 10  Eosinophil % % 3  Basophil % % 1  Neutrophil Absolute 1.8 - 7.8 x 10*3/uL 2.5  Lymphocyte Absolute 1.0 - 4.8 x 10*3/uL 1.3  Monocyte Absolute 0.0 - 0.8 x 10*3/uL 0.5  Eosinophil Absolute 0.0 - 0.5 x 10*3/uL 0.1  Basophil Absolute 0.0 - 0.2 x 10*3/uL 0.0  nRBC <=0 x 10*3/uL 0   April 27 2022:  Diagnostic bilateral mammogram and left breast U/S.   Stable postsurgical  changes in the left breast. No suspicious abnormality is identified bilaterally.  Targeted ultrasound is performed, showing no focal abnormal discrete  cystic or solid lesion at palpable area/area pain left breast 2 o'clock 5 cm from nipple.   May 19 2022:  [facility] Consult Patient seen to re-establish oncology care.  Reviewed patient's history with her She brings with her communications regarding her HA's.  It appears that there is communication between various services as to the possible causes (orthopedic vs vascular).  Patient states that she can't take aspirin because she had some rectal bleeding in the past while on this. Discussed results abdominal imaging.  She is concerned about the results of the abdominal imaging and would like follow up imaging performed however she can not lay still for long periods due to arthritis in her spine Denies any new breast changes  Review of Systems  Constitutional:  Negative for appetite change, chills, fatigue, fever and unexpected weight change.  HENT:   Negative for mouth sores, nosebleeds, sore throat, trouble swallowing and voice change.   Eyes:  Negative for eye problems and icterus.       Vision changes:  None  Respiratory:  Negative for chest tightness, cough, hemoptysis, shortness of breath and wheezing.        PND:  none Orthopnea:  none DOE:    Cardiovascular:  Positive for leg swelling. Negative for chest pain and palpitations.       PND:  none Orthopnea:  none  Gastrointestinal:  Negative for abdominal distention, abdominal pain, blood in stool, constipation, diarrhea, nausea and vomiting.       Has periodic epigastric pain  Endocrine: Negative for hot flashes.       Cold intolerance:  none Heat intolerance:  none  Genitourinary:  Positive for bladder incontinence and frequency. Negative for difficulty urinating, dysuria, hematuria and nocturia.   Musculoskeletal:  Positive for arthralgias, back pain, gait  problem, neck pain and neck stiffness. Negative for myalgias.       Arthritis in back and neck.    Skin:  Negative for itching, rash and wound.  Neurological:  Positive for dizziness, gait problem and headaches. Negative for extremity weakness, light-headedness, numbness, seizures and speech difficulty.  Hematological:  Negative for adenopathy. Does not bruise/bleed easily.  Psychiatric/Behavioral:  Positive for sleep disturbance. Negative for suicidal ideas. The patient is not nervous/anxious.     MEDICAL HISTORY: Past Medical History:  Diagnosis Date   Anxiety    Arthritis    Cancer ([place]) [phone]   left  breast mammary carcinoma   GERD (gastroesophageal reflux disease)    Hyperlipidemia    Hypertension    Personal history of radiation therapy    PONV (postoperative nausea and vomiting)    Thyroid disease     SURGICAL HISTORY: Past Surgical History:  Procedure Laterality Date   ABDOMINAL HYSTERECTOMY  1985   BLADDER SUSPENSION  1992   BREAST SURGERY     multiple br bx   BUNIONECTOMY  1990   lt/rt   CATARACT EXTRACTION     bilat.    COLONOSCOPY  2014   KNEE ARTHROSCOPY  2010   right     SOCIAL HISTORY: Social History   Socioeconomic History   Marital status: Married    Spouse name: Not on file   Number of children: 2   Years of education: Not on file   Highest education level: Not on file  Occupational History   Not on file  Tobacco Use   Smoking status: Never   Smokeless tobacco: Not on file  Substance and Sexual Activity   Alcohol use: No   Drug use: No   Sexual activity: Not on file  Other Topics Concern   Not on file  Social History Narrative   Not on file   Social Determinants of Health   Financial Resource Strain: Not on file  Food Insecurity: Not on file  Transportation Needs: Not on file  Physical Activity: Not on file  Stress: Not on file  Social Connections: Not on file  Intimate Partner Violence: Not on file    FAMILY HISTORY Family  History  Problem Relation Age of Onset   Leukemia Father    Diabetes Father    Cancer Father        luekemia   Skin cancer Mother    Hypertension Mother    Kidney cancer Brother    Melanoma Brother     ALLERGIES:  is allergic to aspirin.  MEDICATIONS:  Current Outpatient Medications  Medication Sig Dispense Refill   amLODipine (NORVASC) 2.5 MG tablet Take 2.5 mg by mouth daily.     benazepril (LOTENSIN) 20 MG tablet Take 20 mg by mouth 2 (two) times daily.     butalbital-acetaminophen-caffeine (FIORICET, ESGIC) 50-325-40 MG per tablet TAKE 1 OR 2 TABLETS EVERY 6 HOURS AS NEEDED FOR SEVERE HEADACHE  4   Cholecalciferol 1000 UNITS capsule Take 2,000 Units by mouth daily.      levothyroxine (SYNTHROID, LEVOTHROID) 75 MCG tablet Take 75 mcg by mouth daily before breakfast.     metoprolol tartrate (LOPRESSOR) 25 MG tablet Take 25 mg by mouth 2 (two) times daily.  3   mirtazapine (REMERON) 15 MG tablet TAKE 1 TABLET BY MOUTH EVERYDAY AT BEDTIME 90 tablet 0   Multiple Vitamin (MULTIVITAMIN) tablet Take 1 tablet by mouth daily.     nitroGLYCERIN (NITROSTAT) 0.4 MG SL tablet Place 0.4 mg under the tongue every 5 (five) minutes as needed for chest pain. Reported on 11/10/2015     pravastatin (PRAVACHOL) 40 MG tablet Take 40 mg by mouth daily.     temazepam (RESTORIL) 15 MG capsule Take 15 mg by mouth at bedtime as needed for sleep.     traMADol (ULTRAM) 50 MG tablet TAKE 1 tablet up to 4 times a day as needed for pain 120 tablet 0   No current facility-administered medications for this visit.    PHYSICAL EXAMINATION:  ECOG PERFORMANCE STATUS: 2 - Symptomatic, <50% confined to bed   Vitals:  05/19/22 1316  BP: (!) 140/82  Pulse: 68  Resp: 14  Temp: 98.4 F (36.9 C)  SpO2: 97%    Filed Weights   05/19/22 1316  Weight: 120 lb (54.4 kg)     Physical Exam Exam conducted with a chaperone present.  Constitutional:      General: She is not in acute distress.    Appearance: She  is normal weight. She is not toxic-appearing or diaphoretic.     Comments: Patient declined breast exam.  Elderly, frail  HENT:     Head: Normocephalic and atraumatic.     Right Ear: External ear normal.     Left Ear: External ear normal.     Nose: Nose normal. No congestion or rhinorrhea.  Eyes:     General: No scleral icterus.    Pupils: Pupils are equal, round, and reactive to light.  Cardiovascular:     Rate and Rhythm: Normal rate and regular rhythm.     Heart sounds: Normal heart sounds. No murmur heard.    No friction rub. No gallop.  Pulmonary:     Effort: Pulmonary effort is normal. No respiratory distress.     Breath sounds: Normal breath sounds. No stridor. No wheezing or rhonchi.  Chest:  Breasts:    Right: Normal. No swelling, bleeding, inverted nipple, mass, nipple discharge, skin change or tenderness.     Left: Normal. No swelling, bleeding, inverted nipple, mass, nipple discharge, skin change or tenderness.  Abdominal:     Palpations: Abdomen is soft.     Tenderness: There is no abdominal tenderness. There is no right CVA tenderness, left CVA tenderness, guarding or rebound.     Hernia: No hernia is present.  Musculoskeletal:        General: No swelling, tenderness, deformity or signs of injury.     Cervical back: Normal range of motion and neck supple. No rigidity or tenderness.     Right lower leg: Edema present.     Left lower leg: Edema present.     Comments: Kyphotic  Lymphadenopathy:     Head:     Right side of head: No submental, submandibular, tonsillar, preauricular, posterior auricular or occipital adenopathy.     Left side of head: No submental, submandibular, tonsillar, preauricular, posterior auricular or occipital adenopathy.     Cervical: No cervical adenopathy.     Right cervical: No superficial, deep or posterior cervical adenopathy.    Left cervical: No superficial, deep or posterior cervical adenopathy.     Upper Body:     Right upper body: No  supraclavicular, axillary or pectoral adenopathy.     Left upper body: No supraclavicular, axillary or pectoral adenopathy.     Lower Body: No right inguinal adenopathy. No left inguinal adenopathy.  Skin:    General: Skin is warm.     Coloration: Skin is not jaundiced.     Findings: No bruising, erythema or rash.  Neurological:     General: No focal deficit present.     Mental Status: She is alert and oriented to person, place, and time.     Cranial Nerves: No cranial nerve deficit.     Gait: Gait abnormal.  Psychiatric:        Mood and Affect: Mood normal.        Behavior: Behavior normal.        Thought Content: Thought content normal.        Judgment: Judgment normal.     LABORATORY DATA: I  have personally reviewed the data as listed:  No visits with results within 1 Month(s) from this visit.  Latest known visit with results is:  Appointment on 01/15/2019  Component Date Value Ref Range Status   WBC 01/15/2019 5.8  4.0 - 10.5 K/uL Final   RBC 01/15/2019 3.70 (L)  3.87 - 5.11 MIL/uL Final   Hemoglobin 01/15/2019 11.9 (L)  12.0 - 15.0 g/dL Final   HCT 01/15/2019 37.1  36.0 - 46.0 % Final   MCV 01/15/2019 100.3 (H)  80.0 - 100.0 fL Final   MCH 01/15/2019 32.2  26.0 - 34.0 pg Final   MCHC 01/15/2019 32.1  30.0 - 36.0 g/dL Final   RDW 01/15/2019 12.7  11.5 - 15.5 % Final   Platelets 01/15/2019 304  150 - 400 K/uL Final   nRBC 01/15/2019 0.0  0.0 - 0.2 % Final   Neutrophils Relative % 01/15/2019 60  % Final   Neutro Abs 01/15/2019 3.5  1.7 - 7.7 K/uL Final   Lymphocytes Relative 01/15/2019 29  % Final   Lymphs Abs 01/15/2019 1.7  0.7 - 4.0 K/uL Final   Monocytes Relative 01/15/2019 8  % Final   Monocytes Absolute 01/15/2019 0.5  0.1 - 1.0 K/uL Final   Eosinophils Relative 01/15/2019 2  % Final   Eosinophils Absolute 01/15/2019 0.1  0.0 - 0.5 K/uL Final   Basophils Relative 01/15/2019 1  % Final   Basophils Absolute 01/15/2019 0.0  0.0 - 0.1 K/uL Final   Immature  Granulocytes 01/15/2019 0  % Final   Abs Immature Granulocytes 01/15/2019 0.02  0.00 - 0.07 K/uL Final   Performed at [facility], [place] [address], [place], [place] [address]   Sodium 01/15/2019 139  135 - 145 mmol/L Final   Potassium 01/15/2019 4.3  3.5 - 5.1 mmol/L Final   Chloride 01/15/2019 103  98 - 111 mmol/L Final   CO2 01/15/2019 29  22 - 32 mmol/L Final   Glucose, Bld 01/15/2019 98  70 - 99 mg/dL Final   BUN 01/15/2019 22  8 - 23 mg/dL Final   Creatinine, Ser 01/15/2019 0.85  0.44 - 1.00 mg/dL Final   Calcium 01/15/2019 9.5  8.9 - 10.3 mg/dL Final   Total Protein 01/15/2019 7.2  6.5 - 8.1 g/dL Final   Albumin 01/15/2019 4.3  3.5 - 5.0 g/dL Final   AST 01/15/2019 28  15 - 41 U/L Final   ALT 01/15/2019 20  0 - 44 U/L Final   Alkaline Phosphatase 01/15/2019 47  38 - 126 U/L Final   Total Bilirubin 01/15/2019 <0.2 (L)  0.3 - 1.2 mg/dL Final   GFR calc non Af Amer 01/15/2019 >60  >60 mL/min Final   GFR calc Af Amer 01/15/2019 >60  >60 mL/min Final   Anion gap 01/15/2019 7  5 - 15 Final   Performed at [facility], [place] [address], [place], [place] [address]   Color, Urine 01/15/2019 YELLOW  YELLOW Final   APPearance 01/15/2019 CLOUDY (A)  CLEAR Final   Specific Gravity, Urine 01/15/2019 1.018  1.005 - 1.030 Final   pH 01/15/2019 5.0  5.0 - 8.0 Final   Glucose, UA 01/15/2019 NEGATIVE  NEGATIVE mg/dL Final   Hgb urine dipstick 01/15/2019 NEGATIVE  NEGATIVE Final   Bilirubin Urine 01/15/2019 NEGATIVE  NEGATIVE Final   Ketones, ur 01/15/2019 NEGATIVE  NEGATIVE mg/dL Final   Protein, ur 01/15/2019 NEGATIVE  NEGATIVE mg/dL Final   Nitrite 01/15/2019 NEGATIVE  NEGATIVE Final   Leukocytes,Ua 01/15/2019 LARGE (A)  NEGATIVE Final   RBC / HPF 01/15/2019 0-5  0 - 5 RBC/hpf Final   WBC, UA 01/15/2019 >50 (H)  0 - 5 WBC/hpf Final   Bacteria, UA 01/15/2019 MANY (A)  NONE SEEN Final   Squamous Epithelial / LPF 01/15/2019 0-5  0 - 5 Final    Mucus 01/15/2019 PRESENT   Final   Performed at [facility] [address] [place], [place] [address]   Specimen Description 01/15/2019    Final                   Value:URINE, CLEAN CATCH Performed at [facility], [place] [address], [place], [place] [address]    Special Requests 01/15/2019    Final                   Value:NONE Performed at [facility], [place] [address], [place], [place] [address]    Culture 01/15/2019 >=100,000 COLONIES/mL ESCHERICHIA COLI (A)   Final   Report Status 01/15/2019 01/17/2019 FINAL   Final   Organism ID, Bacteria 01/15/2019 ESCHERICHIA COLI (A)   Final    RADIOGRAPHIC STUDIES: I have personally reviewed the radiological images as listed and agree with the findings in the report  No results found.  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  Received  about 18 months of tamoxifen because developed LLE DVT in June 2017.  Declined a trial of AI.    April 27 2022:  Diagnostic mammogram and left breast U/S negative for recurrence  Pancreatic cyst and left adrenal adenoma:  Has been followed with serial imaging.  Will obtain CT AP and if stable then continued follow up with imaging may not be necessary  Headaches:  Management per PCP.  Patient has severe OA of spine which may be contributing  Questionable ASA allergy:  Does not appear that she has a true allergy just increased nuisance bleeding.       Cancer Staging  Malignant neoplasm of upper-outer quadrant of left breast in female, estrogen receptor positive ([place]) Staging form: Breast, AJCC 7th Edition - Clinical: Stage IA (T1b, N0, cM0) -  Signed by Chauncey Cruel, MD on 02/21/2014    No problem-specific Assessment & Plan notes found for this encounter.   Orders Placed This Encounter  Procedures   CT Abdomen Pelvis W Contrast    Standing Status:   Future    Standing Expiration Date:   05/19/2023    Order Specific Question:   If indicated for the ordered procedure, I authorize the administration of contrast media per Radiology protocol    Answer:   Yes    Order Specific Question:   Does the patient have a contrast media/X-ray dye allergy?    Answer:   No    Order Specific Question:   Preferred imaging location?    Answer:   External    Order Specific Question:   Is Oral Contrast requested for this exam?    Answer:   Yes, Per Radiology protocol    All questions were answered. The patient knows to call the clinic with any problems, questions or concerns.  This note was electronically  signed.    Barbee Cough, MD  05/19/2022 2:05 PM

## 2022-05-19 ENCOUNTER — Inpatient Hospital Stay: Payer: Medicare Other | Attending: Oncology | Admitting: Oncology

## 2022-05-19 ENCOUNTER — Inpatient Hospital Stay: Payer: Medicare Other

## 2022-05-19 VITALS — BP 140/82 | HR 68 | Temp 98.4°F | Resp 14 | Ht 60.0 in | Wt 120.0 lb

## 2022-05-19 DIAGNOSIS — K862 Cyst of pancreas: Secondary | ICD-10-CM | POA: Diagnosis not present

## 2022-05-19 DIAGNOSIS — Z170 Estrogen receptor positive status [ER+]: Secondary | ICD-10-CM

## 2022-05-19 DIAGNOSIS — I10 Essential (primary) hypertension: Secondary | ICD-10-CM

## 2022-05-19 DIAGNOSIS — D4410 Neoplasm of uncertain behavior of unspecified adrenal gland: Secondary | ICD-10-CM

## 2022-05-19 DIAGNOSIS — Z90710 Acquired absence of both cervix and uterus: Secondary | ICD-10-CM

## 2022-05-19 DIAGNOSIS — Z923 Personal history of irradiation: Secondary | ICD-10-CM

## 2022-05-19 DIAGNOSIS — Z853 Personal history of malignant neoplasm of breast: Secondary | ICD-10-CM

## 2022-05-19 DIAGNOSIS — D3502 Benign neoplasm of left adrenal gland: Secondary | ICD-10-CM

## 2022-05-19 DIAGNOSIS — Z86718 Personal history of other venous thrombosis and embolism: Secondary | ICD-10-CM | POA: Insufficient documentation

## 2022-05-19 DIAGNOSIS — R519 Headache, unspecified: Secondary | ICD-10-CM | POA: Diagnosis not present

## 2022-07-07 LAB — CBC AND DIFFERENTIAL
HCT: 37 (ref 36–46)
Hemoglobin: 12.3 (ref 12.0–16.0)
Neutrophils Absolute: 3.22
Platelets: 266 10*3/uL (ref 150–400)
WBC: 4.8

## 2022-07-07 LAB — BASIC METABOLIC PANEL
BUN: 14 (ref 4–21)
CO2: 31 — AB (ref 13–22)
Chloride: 100 (ref 99–108)
Creatinine: 0.6 (ref 0.5–1.1)
Glucose: 97
Potassium: 4.1 mEq/L (ref 3.5–5.1)
Sodium: 138 (ref 137–147)

## 2022-07-07 LAB — COMPREHENSIVE METABOLIC PANEL
Albumin: 4.3 (ref 3.5–5.0)
Calcium: 9.5 (ref 8.7–10.7)

## 2022-07-07 LAB — HEPATIC FUNCTION PANEL
ALT: 21 U/L (ref 7–35)
AST: 40 — AB (ref 13–35)
Alkaline Phosphatase: 85 (ref 25–125)
Bilirubin, Total: 0.6

## 2022-07-07 LAB — CBC: RBC: 3.84 — AB (ref 3.87–5.11)

## 2022-07-09 ENCOUNTER — Encounter: Payer: Self-pay | Admitting: Oncology

## 2022-07-15 ENCOUNTER — Inpatient Hospital Stay: Payer: Medicare Other | Attending: Oncology | Admitting: Oncology

## 2022-07-15 VITALS — BP 175/94 | HR 64 | Temp 98.2°F | Resp 18 | Ht 60.0 in | Wt 119.3 lb

## 2022-07-15 DIAGNOSIS — M545 Low back pain, unspecified: Secondary | ICD-10-CM

## 2022-07-15 DIAGNOSIS — G8929 Other chronic pain: Secondary | ICD-10-CM

## 2022-07-15 DIAGNOSIS — Z170 Estrogen receptor positive status [ER+]: Secondary | ICD-10-CM

## 2022-07-15 DIAGNOSIS — Z923 Personal history of irradiation: Secondary | ICD-10-CM

## 2022-07-15 DIAGNOSIS — C50412 Malignant neoplasm of upper-outer quadrant of left female breast: Secondary | ICD-10-CM

## 2022-07-15 DIAGNOSIS — D4410 Neoplasm of uncertain behavior of unspecified adrenal gland: Secondary | ICD-10-CM

## 2022-07-15 NOTE — Progress Notes (Signed)
[place] [facility] Initial Visit:  Patient Care Team: Raina Mina., MD as PCP - General (Internal Medicine) Magrinat, Virgie Dad, MD (Inactive) as Consulting Physician (Oncology) Thea Silversmith, MD as Consulting Physician (Radiation Oncology) Richardo Priest, MD as Referring Physician (Cardiology) Fanny Skates, MD as Consulting Physician (General Surgery) Marti Sleigh, MD as Referring Physician (Gynecology)  CHIEF COMPLAINTS/PURPOSE OF CONSULTATION:   HISTORY OF PRESENTING ILLNESS: Zoe Boone 81 y.o. female is here because of  history of breast cancer  Medical history notable for left-sided breast cancer diagnosed in 2015 and treated with adjuvant radiation therapy, hypertension, hyperlipidemia, hysterectomy, thyroid disease  March 21 2014:  Lumpectomy left breast and sentinel lymph node sampling for a pT1b pN0, stage IA invasive ductal carcinoma, repeat HER-2 again negative.  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    December 26 2019:  MRI abdomen.   Unilocular simple appearing cystic lesion in the tail the pancreas  measures 1.5 by 0.8 by 0.7 cm. No abnormal enhancement. By  my measurements this lesion was previously 1.4 by 0.7 by 0.8 cm. This does not qualify as interval growth. This lesion is mildly reduced in size compared back through 04/04/2017. No definite  connectivity to the dorsal pancreatic duct. This lesion was present although more difficult to measure and probably smaller back on 09/13/2011. Adrenals/Urinary Tract: 1.2 by 1.6 cm left adrenal adenoma with  dropout of signal on out of phase images. Retained fetal lobularity.  Small benign-appearing renal cysts.   March 10 2022:    Sodium 135 - 146 MMOL/L 143   Potassium 3.5 - 5.3 MMOL/L 4.3 NO VISIBLE HEMOLYSIS  Chloride 98 - 110 MMOL/L 104   CO2 21 - 31 MMOL/L 31   BUN 8 - 24 MG/DL 23   Glucose 70 - 99 MG/DL 82   Creatinine 0.60 - 1.20 MG/DL 0.87   Calcium 8.5 - 10.5 MG/DL 9.5   Total Protein 6.4 - 8.9 G/DL 6.7   Albumin 3.5 - 5.7 G/DL 4.3   Total Bilirubin 0.0 - 1.0 MG/DL 0.6   Alkaline Phosphatase 34 - 104 IU/L or U/L 88   AST (SGOT) 13 - 39 IU/L or U/L 25   ALT (SGPT) 7 - 52 IU/L or U/L 16   Anion Gap 4 - 14 MMOL/L 8   Est. GFR >=60 ML/MIN/1.73 M*2 68    WBC 4.4 - 11.0 x 10*3/uL 4.5  RBC 4.10 - 5.10 x 10*6/uL 3.84 Low  Hemoglobin 12.3 - 15.3 G/DL 12.2 Low   Hematocrit 35.9 - 44.6 % 36.8  MCV 80.0 - 96.0 FL 95.9  MCH 27.5 - 33.2 PG 31.7  MCHC 33.0 - 37.0 G/DL 33.0  RDW 12.3 - 17.0 % 12.8  Platelets 150 - 450 X 10*3/uL 259  MPV 6.8 - 10.2 FL 8.4  Neutrophil % % 57  Lymphocyte % % 30  Monocyte % % 10  Eosinophil % % 3  Basophil % % 1  Neutrophil Absolute 1.8 - 7.8 x 10*3/uL 2.5  Lymphocyte Absolute 1.0 - 4.8 x 10*3/uL 1.3  Monocyte Absolute 0.0 - 0.8 x 10*3/uL 0.5  Eosinophil Absolute 0.0 - 0.5 x 10*3/uL 0.1  Basophil Absolute 0.0 - 0.2 x 10*3/uL 0.0  nRBC <=0 x 10*3/uL 0   April 27 2022:  Diagnostic bilateral mammogram and left breast U/S.   Stable postsurgical changes in the left breast. No suspicious abnormality  is identified bilaterally.  Targeted ultrasound is performed, showing no focal abnormal discrete  cystic or solid lesion at palpable area/area pain left breast 2 o'clock 5 cm from nipple.   May 19 2022:  [place] Consult Patient seen to re-establish oncology care.  Reviewed patient's history with her Zoe Boone brings with her communications regarding her HA's.  It appears that there is communication between various services as to the possible causes (orthopedic vs vascular).  Patient states that Zoe Boone can't take aspirin because Zoe Boone had some rectal bleeding in the past while on this. Discussed results abdominal imaging.  Zoe Boone is concerned about the results of the abdominal imaging and would like follow up imaging performed however Zoe Boone can not lay still for long periods due to arthritis in her spine.  Denies any new breast changes  July 07 2022:  CT AP-- No evidence for metastatic disease in the abdomen or pelvis.  16 mm cystic lesion in the tail of pancreas has decreased from 19 mm on the CT scan from 02/13/2018.  1-2 mm nonobstructing stone lower pole right kidney.  Stable 16 mm left adrenal nodule characterized as adenoma on previous MRI. No followup imaging is recommended.   July 15 2022:  Scheduled follow up regarding history of breast cancer.  Reviewed results of imaging with patient and daughters.  Suggested that since imaging results have been stable may be able to forgo further imaging.  Has chronic back pain which Zoe Boone attributes to scoliosis, osteoporosis and arthritis.  Has been seen by orthopedics and pain clinic for this.    WBC 4.8 hemoglobin 12.3 platelet count 266 CMP notable for AST of 49 bicarb 31  Review of Systems  Constitutional:  Negative for appetite change, chills, fatigue, fever and unexpected weight change.  HENT:   Negative for mouth sores, nosebleeds, sore throat, trouble swallowing and voice change.   Eyes:  Negative for eye problems and icterus.        Vision changes:  None  Respiratory:  Negative for chest tightness, cough, hemoptysis, shortness of breath and wheezing.        PND:  none Orthopnea:  none DOE:    Cardiovascular:  Positive for leg swelling. Negative for chest pain and palpitations.       PND:  none Orthopnea:  none  Gastrointestinal:  Negative for abdominal distention, abdominal pain, blood in stool, constipation, diarrhea, nausea and vomiting.       Has periodic epigastric pain  Endocrine: Negative for hot flashes.       Cold intolerance:  none Heat intolerance:  none  Genitourinary:  Positive for bladder incontinence and frequency. Negative for difficulty urinating, dysuria, hematuria and nocturia.   Musculoskeletal:  Positive for arthralgias, back pain, gait problem, neck pain and neck stiffness. Negative for myalgias.       Arthritis in back and neck.    Skin:  Negative for itching, rash and wound.  Neurological:  Positive for dizziness, gait problem and headaches. Negative for extremity weakness, light-headedness, numbness, seizures and speech difficulty.  Hematological:  Negative for adenopathy. Does not bruise/bleed easily.  Psychiatric/Behavioral:  Positive for sleep disturbance. Negative for suicidal ideas. The patient is not nervous/anxious.     MEDICAL HISTORY: Past Medical History:  Diagnosis Date   Anxiety    Arthritis    Cancer ([place]) [phone]   left breast mammary carcinoma   GERD (gastroesophageal reflux disease)    Hyperlipidemia    Hypertension    Personal history of radiation therapy    PONV (postoperative nausea and vomiting)    Thyroid disease     SURGICAL HISTORY: Past Surgical History:  Procedure Laterality Date   ABDOMINAL HYSTERECTOMY  1985   BLADDER SUSPENSION  1992   BREAST SURGERY     multiple br bx   BUNIONECTOMY  1990   lt/rt   CATARACT EXTRACTION     bilat.    COLONOSCOPY  2014   KNEE ARTHROSCOPY  2010   right     SOCIAL HISTORY: Social History   Socioeconomic  History   Marital status: Married    Spouse name: Not on file   Number of children: 2   Years of education: Not on file   Highest education level: Not on file  Occupational History   Not on file  Tobacco Use   Smoking status: Never   Smokeless tobacco: Not on file  Substance and Sexual Activity   Alcohol use: No   Drug use: No   Sexual activity: Not on file  Other Topics Concern   Not on file  Social History Narrative   Not on file   Social Determinants of Health   Financial Resource Strain: Not on file  Food Insecurity: Not on file  Transportation Needs: Not on file  Physical Activity: Not on file  Stress: Not on file  Social Connections: Not on file  Intimate Partner Violence: Not on file    FAMILY HISTORY Family History  Problem Relation Age of Onset   Leukemia Father    Diabetes Father    Cancer Father        luekemia   Skin cancer Mother    Hypertension Mother    Kidney cancer Brother    Melanoma Brother     ALLERGIES:  is allergic to aspirin.  MEDICATIONS:  Current Outpatient Medications  Medication Sig Dispense Refill   amLODipine (NORVASC) 2.5 MG tablet Take 2.5 mg by mouth daily.     benazepril (LOTENSIN) 20 MG tablet Take 20 mg by mouth 2 (two) times daily.     butalbital-acetaminophen-caffeine (FIORICET, ESGIC) 50-325-40 MG per tablet TAKE 1 OR 2 TABLETS EVERY 6 HOURS AS NEEDED FOR SEVERE HEADACHE  4   Cholecalciferol 1000 UNITS capsule Take 2,000 Units by mouth daily.      levothyroxine (SYNTHROID, LEVOTHROID) 75 MCG tablet Take 75 mcg by mouth daily before breakfast.     metoprolol tartrate (LOPRESSOR) 25 MG tablet Take 25 mg by mouth 2 (two) times daily.  3   mirtazapine (REMERON) 15 MG tablet TAKE 1 TABLET  BY MOUTH EVERYDAY AT BEDTIME 90 tablet 0   Multiple Vitamin (MULTIVITAMIN) tablet Take 1 tablet by mouth daily.     nitroGLYCERIN (NITROSTAT) 0.4 MG SL tablet Place 0.4 mg under the tongue every 5 (five) minutes as needed for chest pain.  Reported on 11/10/2015     pravastatin (PRAVACHOL) 40 MG tablet Take 40 mg by mouth daily.     temazepam (RESTORIL) 15 MG capsule Take 15 mg by mouth at bedtime as needed for sleep.     traMADol (ULTRAM) 50 MG tablet TAKE 1 tablet up to 4 times a day as needed for pain 120 tablet 0   No current facility-administered medications for this visit.    PHYSICAL EXAMINATION:  ECOG PERFORMANCE STATUS: 2 - Symptomatic, <50% confined to bed   There were no vitals filed for this visit.   There were no vitals filed for this visit.    Physical Exam Vitals and nursing note reviewed. Exam conducted with a chaperone present.  Constitutional:      General: Zoe Boone is not in acute distress.    Appearance: Zoe Boone is normal weight. Zoe Boone is not toxic-appearing or diaphoretic.     Comments: Elderly, frail.  Here with daughters  HENT:     Head: Normocephalic and atraumatic.     Right Ear: External ear normal.     Left Ear: External ear normal.     Nose: Nose normal. No congestion or rhinorrhea.  Eyes:     General: No scleral icterus.    Pupils: Pupils are equal, round, and reactive to light.  Cardiovascular:     Rate and Rhythm: Normal rate and regular rhythm.     Heart sounds: Normal heart sounds. No murmur heard.    No friction rub. No gallop.  Pulmonary:     Effort: Pulmonary effort is normal. No respiratory distress.     Breath sounds: Normal breath sounds. No stridor. No wheezing or rhonchi.  Chest:  Breasts:    Right: Normal. No swelling, bleeding, inverted nipple, mass, nipple discharge, skin change or tenderness.     Left: Normal. No swelling, bleeding, inverted nipple, mass, nipple discharge, skin change or tenderness.     Comments: Crease in left breast running from 3:00 to 9:00 from prior lumpectomy Abdominal:     Palpations: Abdomen is soft.     Tenderness: There is no abdominal tenderness. There is no right CVA tenderness, left CVA tenderness, guarding or rebound.     Hernia: No hernia  is present.  Musculoskeletal:        General: No swelling, tenderness, deformity or signs of injury.     Cervical back: Normal range of motion and neck supple. No rigidity or tenderness.     Right lower leg: No edema.     Left lower leg: No edema.     Comments: Kyphotic  Lymphadenopathy:     Head:     Right side of head: No submental, submandibular, tonsillar, preauricular, posterior auricular or occipital adenopathy.     Left side of head: No submental, submandibular, tonsillar, preauricular, posterior auricular or occipital adenopathy.     Cervical: No cervical adenopathy.     Right cervical: No superficial, deep or posterior cervical adenopathy.    Left cervical: No superficial, deep or posterior cervical adenopathy.     Upper Body:     Right upper body: No supraclavicular, axillary or pectoral adenopathy.     Left upper body: No supraclavicular, axillary or pectoral adenopathy.  Lower Body: No right inguinal adenopathy. No left inguinal adenopathy.  Skin:    General: Skin is warm.     Coloration: Skin is not jaundiced.     Findings: No bruising, erythema or rash.  Neurological:     General: No focal deficit present.     Mental Status: Zoe Boone is alert and oriented to person, place, and time.     Cranial Nerves: No cranial nerve deficit.     Gait: Gait abnormal.  Psychiatric:        Mood and Affect: Mood normal.        Behavior: Behavior normal.        Thought Content: Thought content normal.        Judgment: Judgment normal.     LABORATORY DATA: I have personally reviewed the data as listed:  Abstract on 07/08/2022  Component Date Value Ref Range Status   Hemoglobin 07/07/2022 12.3  12.0 - 16.0 Final   HCT 07/07/2022 37  36 - 46 Final   Neutrophils Absolute 07/07/2022 3.22   Final   Platelets 07/07/2022 266  150 - 400 K/uL Final   WBC 07/07/2022 4.8   Final   RBC 07/07/2022 3.84 (A)  3.87 - 5.11 Final   Glucose 07/07/2022 97   Final   BUN 07/07/2022 14  4 - 21 Final    CO2 07/07/2022 31 (A)  13 - 22 Final   Creatinine 07/07/2022 0.6  0.5 - 1.1 Final   Potassium 07/07/2022 4.1  3.5 - 5.1 mEq/L Final   Sodium 07/07/2022 138  137 - 147 Final   Chloride 07/07/2022 100  99 - 108 Final   Calcium 07/07/2022 9.5  8.7 - 10.7 Final   Albumin 07/07/2022 4.3  3.5 - 5.0 Final   Alkaline Phosphatase 07/07/2022 85  25 - 125 Final   ALT 07/07/2022 21  7 - 35 U/L Final   AST 07/07/2022 40 (A)  13 - 35 Final   Bilirubin, Total 07/07/2022 0.6   Final    RADIOGRAPHIC STUDIES: I have personally reviewed the radiological images as listed and agree with the findings in the report  No results found.  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  Received  about 18 months of tamoxifen because developed LLE DVT in June 2017.  Declined a trial of AI.    April 27 2022:  Diagnostic mammogram and left breast U/S negative for recurrence  Continue yearly mammograms  Pancreatic cyst and left adrenal adenoma:  Has been followed with serial imaging.  Will obtain CT AP and if stable then continued follow up with imaging may not be necessary  July 07 2022:  CT AP-- No evidence for metastatic disease in the abdomen or pelvis.  Chronic stable findings.  No followup imaging is recommended by radiology.   July 15 2022:  Conveyed Radiology recommendations to patient and daughter.    Chronic back pain:  Not cancer related.  Likely due to scoliosis, osteoporosis.  Management per Orthopedics/ Pain service/ PCP  Headaches:  Management per PCP.  Patient has severe OA of spine which may be contributing  Questionable ASA allergy:  Does not appear that Zoe Boone has a true allergy just increased nuisance  bleeding.  Cancer Staging  Malignant neoplasm of upper-outer quadrant of left breast in female, estrogen receptor positive ([place]) Staging form: Breast, AJCC 7th Edition - Clinical: Stage IA (T1b, N0, cM0) - Signed by Chauncey Cruel, MD on 02/21/2014    No problem-specific Assessment & Plan notes found for this encounter.   No orders of the defined types were placed in this encounter.  33  minutes was spent in patient care.  This included time spent preparing to see the patient (e.g., review of tests), obtaining and/or reviewing separately obtained history, counseling and educating the patient/family/caregiver, ordering tests; documenting clinical information in the electronic or other health record, independently interpreting results and communicating results to the patient/family/caregiver as well as coordination of care.      All questions were answered. The patient knows to call the clinic with any problems, questions or concerns.  This note was electronically signed.    Barbee Cough, MD  07/15/2022 8:38 AM

## 2022-07-19 ENCOUNTER — Ambulatory Visit: Payer: Medicare Other | Admitting: Oncology

## 2022-07-20 DIAGNOSIS — G8929 Other chronic pain: Secondary | ICD-10-CM | POA: Insufficient documentation
